# Patient Record
Sex: Female | Born: 1980 | Hispanic: Yes | Marital: Married | Smoking: Never smoker
Health system: Southern US, Community
[De-identification: ages and names within clinical notes are randomized; demographics above are authoritative.]

## PROBLEM LIST (undated history)

## (undated) DIAGNOSIS — K5900 Constipation, unspecified: Secondary | ICD-10-CM

## (undated) DIAGNOSIS — E079 Disorder of thyroid, unspecified: Secondary | ICD-10-CM

## (undated) DIAGNOSIS — I10 Essential (primary) hypertension: Secondary | ICD-10-CM

## (undated) HISTORY — DX: Disorder of thyroid, unspecified: E07.9

## (undated) HISTORY — DX: Constipation, unspecified: K59.00

---

## 2012-02-19 ENCOUNTER — Other Ambulatory Visit (HOSPITAL_COMMUNITY)
Admission: RE | Admit: 2012-02-19 | Discharge: 2012-02-19 | Disposition: A | Discharge status: Home / Self Care | Payer: 59 | Source: Ambulatory Visit | Attending: Family Medicine | Admitting: Family Medicine

## 2012-02-19 DIAGNOSIS — Z1151 Encounter for screening for human papillomavirus (HPV): Secondary | ICD-10-CM | POA: Insufficient documentation

## 2012-02-19 DIAGNOSIS — Z124 Encounter for screening for malignant neoplasm of cervix: Secondary | ICD-10-CM | POA: Insufficient documentation

## 2012-02-22 ENCOUNTER — Other Ambulatory Visit: Payer: Self-pay | Admitting: Family Medicine

## 2012-02-22 DIAGNOSIS — O905 Postpartum thyroiditis: Secondary | ICD-10-CM

## 2012-02-23 ENCOUNTER — Ambulatory Visit
Admission: RE | Admit: 2012-02-23 | Discharge: 2012-02-23 | Disposition: A | Discharge status: Home / Self Care | Payer: 59 | Source: Ambulatory Visit | Attending: Family Medicine | Admitting: Family Medicine

## 2012-02-23 DIAGNOSIS — O905 Postpartum thyroiditis: Secondary | ICD-10-CM

## 2012-11-22 ENCOUNTER — Encounter: Payer: 59 | Attending: Family Medicine | Admitting: *Deleted

## 2012-11-22 ENCOUNTER — Encounter (INDEPENDENT_AMBULATORY_CARE_PROVIDER_SITE_OTHER): Payer: Self-pay

## 2012-11-22 ENCOUNTER — Encounter: Payer: Self-pay | Admitting: *Deleted

## 2012-11-22 VITALS — Ht 61.0 in | Wt 161.0 lb

## 2012-11-22 DIAGNOSIS — Z713 Dietary counseling and surveillance: Secondary | ICD-10-CM | POA: Insufficient documentation

## 2012-11-22 DIAGNOSIS — E663 Overweight: Secondary | ICD-10-CM | POA: Insufficient documentation

## 2012-11-22 NOTE — Progress Notes (Signed)
  Medical Nutrition Therapy:  Appt start time: 1000 end time:  1100.   Assessment:  Primary concerns today: Maria Vasquez is here for nutrition counseling.  She wants to lose weight.  She recently had liposuction and a tummy tuck, but she is still unhappy with her weight.  She Work 3rd shift and her schedule is difficult.  She is moderately active, but her diet is low in whole grains, fruits, vegetables, and dairy.  She has hypothyroidism  MEDICATIONS: synthroid   DIETARY INTAKE:  Usual eating pattern includes 2-3 meals and 0-2 snacks per day.  Everyday foods include starches and proteins.    24-hr recall:  B ( AM): coffee with eggo with cheese  Snk ( AM): none  L ( PM): rice, fish, salad sometimes Snk ( PM): almonds or might not snack D ( PM): rice, meat, bread.  Maybe pasta with meat Snk ( PM): not usually.  Might have almonds Beverages: water  Usual physical activity: zumba twice a week.  And sometimes walks  Estimated energy needs: 1500 calories 170 g carbohydrates 112 g protein 42 g fat   Nutritional Diagnosis:  NB-1.1 Food and nutrition-related knowledge deficit As related to proper balance of different food groups.  As evidenced by dietary recall low in fruits, vegetables, dairy, and whole grains.    Intervention:  Nutrition counseling provided.  Discouraged excessive weight loss.   Discussed MyPlate recommendations for meal planning.  Encouraged more fruits, vegetables, lean proteins, and whole grains.  Recommended daily activity.  Discussed reading food labels for saturated fats, sodium, sugar, and fiber.  discouraged meal skipping  Handouts given during visit include:  Reading food labels  My meal plan card  Monitoring/Evaluation:  Dietary intake, exercise, and body weight prn.

## 2012-11-22 NOTE — Patient Instructions (Signed)
Starch: whole grains: wheat bread, brown rice,  Protein: lean is best- take the skin off, trim excess fat off meat; bake, broil, grill Fruit: fresh is best Vegetable: fresh is best Dairy: soy or almond milk 1 glass each day  Aim for 30 minutes physical activity most day.

## 2013-05-29 ENCOUNTER — Other Ambulatory Visit: Payer: Self-pay | Admitting: Family Medicine

## 2013-05-29 ENCOUNTER — Encounter (INDEPENDENT_AMBULATORY_CARE_PROVIDER_SITE_OTHER): Payer: Self-pay

## 2013-05-29 ENCOUNTER — Ambulatory Visit
Admission: RE | Admit: 2013-05-29 | Discharge: 2013-05-29 | Disposition: A | Discharge status: Home / Self Care | Payer: 59 | Source: Ambulatory Visit | Attending: Family Medicine | Admitting: Family Medicine

## 2013-05-29 DIAGNOSIS — M25559 Pain in unspecified hip: Secondary | ICD-10-CM

## 2016-07-04 DIAGNOSIS — Z79899 Other long term (current) drug therapy: Secondary | ICD-10-CM | POA: Diagnosis not present

## 2016-07-04 DIAGNOSIS — Z131 Encounter for screening for diabetes mellitus: Secondary | ICD-10-CM | POA: Diagnosis not present

## 2016-07-04 DIAGNOSIS — E559 Vitamin D deficiency, unspecified: Secondary | ICD-10-CM | POA: Diagnosis not present

## 2016-07-04 DIAGNOSIS — R5383 Other fatigue: Secondary | ICD-10-CM | POA: Diagnosis not present

## 2016-07-04 DIAGNOSIS — R0602 Shortness of breath: Secondary | ICD-10-CM | POA: Diagnosis not present

## 2016-07-04 DIAGNOSIS — E039 Hypothyroidism, unspecified: Secondary | ICD-10-CM | POA: Diagnosis not present

## 2016-07-05 DIAGNOSIS — E875 Hyperkalemia: Secondary | ICD-10-CM | POA: Diagnosis not present

## 2016-07-07 DIAGNOSIS — E559 Vitamin D deficiency, unspecified: Secondary | ICD-10-CM | POA: Diagnosis not present

## 2016-07-07 DIAGNOSIS — Z79899 Other long term (current) drug therapy: Secondary | ICD-10-CM | POA: Diagnosis not present

## 2016-08-05 DIAGNOSIS — Z79899 Other long term (current) drug therapy: Secondary | ICD-10-CM | POA: Diagnosis not present

## 2016-08-05 DIAGNOSIS — E559 Vitamin D deficiency, unspecified: Secondary | ICD-10-CM | POA: Diagnosis not present

## 2016-09-09 DIAGNOSIS — Z79899 Other long term (current) drug therapy: Secondary | ICD-10-CM | POA: Diagnosis not present

## 2016-09-30 DIAGNOSIS — Z79899 Other long term (current) drug therapy: Secondary | ICD-10-CM | POA: Diagnosis not present

## 2016-09-30 DIAGNOSIS — E559 Vitamin D deficiency, unspecified: Secondary | ICD-10-CM | POA: Diagnosis not present

## 2016-12-11 DIAGNOSIS — B9689 Other specified bacterial agents as the cause of diseases classified elsewhere: Secondary | ICD-10-CM | POA: Diagnosis not present

## 2016-12-11 DIAGNOSIS — J069 Acute upper respiratory infection, unspecified: Secondary | ICD-10-CM | POA: Diagnosis not present

## 2016-12-11 DIAGNOSIS — R509 Fever, unspecified: Secondary | ICD-10-CM | POA: Diagnosis not present

## 2016-12-11 DIAGNOSIS — R51 Headache: Secondary | ICD-10-CM | POA: Diagnosis not present

## 2016-12-13 DIAGNOSIS — E559 Vitamin D deficiency, unspecified: Secondary | ICD-10-CM | POA: Diagnosis not present

## 2016-12-13 DIAGNOSIS — J069 Acute upper respiratory infection, unspecified: Secondary | ICD-10-CM | POA: Diagnosis not present

## 2017-02-17 DIAGNOSIS — E559 Vitamin D deficiency, unspecified: Secondary | ICD-10-CM | POA: Diagnosis not present

## 2017-02-17 DIAGNOSIS — Z79899 Other long term (current) drug therapy: Secondary | ICD-10-CM | POA: Diagnosis not present

## 2017-02-20 DIAGNOSIS — Z8632 Personal history of gestational diabetes: Secondary | ICD-10-CM | POA: Diagnosis not present

## 2017-02-20 DIAGNOSIS — R61 Generalized hyperhidrosis: Secondary | ICD-10-CM | POA: Diagnosis not present

## 2017-02-20 DIAGNOSIS — E039 Hypothyroidism, unspecified: Secondary | ICD-10-CM | POA: Diagnosis not present

## 2017-05-22 DIAGNOSIS — A084 Viral intestinal infection, unspecified: Secondary | ICD-10-CM | POA: Diagnosis not present

## 2017-07-06 LAB — LAB REPORT - SCANNED: EGFR: 94

## 2017-11-01 ENCOUNTER — Ambulatory Visit
Admission: RE | Admit: 2017-11-01 | Discharge: 2017-11-01 | Disposition: A | Discharge status: Home / Self Care | Payer: Self-pay | Source: Ambulatory Visit | Attending: Family Medicine | Admitting: Family Medicine

## 2017-11-01 ENCOUNTER — Other Ambulatory Visit: Payer: Self-pay | Admitting: Family Medicine

## 2017-11-01 DIAGNOSIS — R52 Pain, unspecified: Secondary | ICD-10-CM

## 2017-11-01 DIAGNOSIS — E039 Hypothyroidism, unspecified: Secondary | ICD-10-CM | POA: Diagnosis not present

## 2017-11-01 DIAGNOSIS — M79641 Pain in right hand: Secondary | ICD-10-CM | POA: Diagnosis not present

## 2018-02-21 DIAGNOSIS — Z6831 Body mass index (BMI) 31.0-31.9, adult: Secondary | ICD-10-CM | POA: Diagnosis not present

## 2018-02-21 DIAGNOSIS — E039 Hypothyroidism, unspecified: Secondary | ICD-10-CM | POA: Diagnosis not present

## 2018-05-16 DIAGNOSIS — M62830 Muscle spasm of back: Secondary | ICD-10-CM | POA: Diagnosis not present

## 2018-06-28 DIAGNOSIS — Z20828 Contact with and (suspected) exposure to other viral communicable diseases: Secondary | ICD-10-CM | POA: Diagnosis not present

## 2018-06-28 DIAGNOSIS — R509 Fever, unspecified: Secondary | ICD-10-CM | POA: Diagnosis not present

## 2019-03-18 ENCOUNTER — Ambulatory Visit
Admission: RE | Admit: 2019-03-18 | Discharge: 2019-03-18 | Disposition: A | Discharge status: Home / Self Care | Payer: 59 | Source: Ambulatory Visit | Attending: Family Medicine | Admitting: Family Medicine

## 2019-03-18 ENCOUNTER — Other Ambulatory Visit: Payer: Self-pay | Admitting: Family Medicine

## 2019-03-18 DIAGNOSIS — R1031 Right lower quadrant pain: Secondary | ICD-10-CM

## 2019-03-18 MED ORDER — IOPAMIDOL (ISOVUE-300) INJECTION 61%
100.0000 mL | Freq: Once | INTRAVENOUS | Status: AC | PRN
  Administered 2019-03-18: 100 mL via INTRAVENOUS

## 2019-03-26 ENCOUNTER — Other Ambulatory Visit (HOSPITAL_COMMUNITY): Payer: Self-pay | Admitting: Family Medicine

## 2019-03-26 ENCOUNTER — Other Ambulatory Visit: Payer: Self-pay | Admitting: Family Medicine

## 2019-03-26 DIAGNOSIS — R1011 Right upper quadrant pain: Secondary | ICD-10-CM

## 2019-04-04 ENCOUNTER — Other Ambulatory Visit: Payer: Self-pay

## 2019-04-04 ENCOUNTER — Encounter (HOSPITAL_COMMUNITY)
Admission: RE | Admit: 2019-04-04 | Discharge: 2019-04-04 | Disposition: A | Discharge status: Home / Self Care | Payer: 59 | Source: Ambulatory Visit | Attending: Family Medicine | Admitting: Family Medicine

## 2019-04-04 DIAGNOSIS — R1011 Right upper quadrant pain: Secondary | ICD-10-CM | POA: Diagnosis not present

## 2019-04-04 MED ORDER — TECHNETIUM TC 99M MEBROFENIN IV KIT
5.0000 | PACK | Freq: Once | INTRAVENOUS | Status: AC | PRN
  Administered 2019-04-04: 14:00:00 5.25 via INTRAVENOUS

## 2019-04-21 ENCOUNTER — Other Ambulatory Visit: Payer: Self-pay

## 2019-04-21 ENCOUNTER — Encounter: Payer: 59 | Attending: Surgery | Admitting: Dietician

## 2019-04-21 ENCOUNTER — Encounter: Payer: Self-pay | Admitting: Dietician

## 2019-04-21 DIAGNOSIS — R7303 Prediabetes: Secondary | ICD-10-CM | POA: Diagnosis present

## 2019-04-21 DIAGNOSIS — E669 Obesity, unspecified: Secondary | ICD-10-CM | POA: Insufficient documentation

## 2019-04-21 NOTE — Patient Instructions (Addendum)
Consider drinking decaf coffee or tea or herbal tea prior to sleep. Recommend a MVI daily as well as calcium (take 2 hours apart) Continue to stay active. Aim for 3 small meals per day.  Include a small amount of protein at each meal, a complex carbohydrate choice, and non starchy vegetables. Avoid skipping meals.

## 2019-04-21 NOTE — Progress Notes (Signed)
Medical Nutrition Therapy:  Appt start time: 1100 end time:  1200.   Assessment:  Primary concerns today: Patient is here today alone.  She would like to lose weight and states that she would like the gastric sleeve surgery.    History includes GDM, prediabetes, hepatomegaly, fatty liver, hypercholesterolemia, hypothyroidism, depression.  She has seen a Psychologist, sport and exercise who has given her a letter of medical necessity for bariatric surgery. Last seen in our office in 2014.  She follows a very restrictive diet and only lost 1 lb in the past 3 weeks. She has tried multiple diets without long term weight loss and is currently trying to follow a keto diet. Sleep is inadequate most days of the week.  Bowel movements 3 times per day.  Diarrhea last night and was neon yellow.  She states that she has depression related to her weight and body image.  She reports that her husband is supportive of her current weight.  She has felt like harming herself in the past but not currently. She states that when she is depressed, she just wants to be alone.  She states that she has no one to call when she feels that.  Her family does not live locally. She states that she also has depression related to her health and liver pain.  Weight hx: 200 lbs 04/21/19 highest adult weight.  BMI 38 145 lbs lowest adult weight at 39 yo 160 lbs after liposuction and tummy tuck in 2014.    Sleep:  She finds it difficult to sleep 8 hours consecutively due to working 3rd shift and her daughter's doing school at home.  She usually sleeps from 7:30 am - 12:15 pm (4 hours & 45 minutes).  Sometimes she takes a nap for 1 1/2-2 hours prior to starting her shift.  On her off days she sleeps 9-12 hours at night.  Patient lives with her husband and 2 daughters (10 & 39 yo).  Her daughters are doing school at home.  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  She works 4 days per week and every other weekend.  She is from  [place].  Preferred Learning Style:   No preference indicated   Learning Readiness:   Ready  MEDICATIONS: see lit   DIETARY INTAKE: She eats only during specific hours each day.    24-hr recall:  B ( AM): coffee (unsweeted powdered creamer, scrambled eggs and cheese  Snk ( AM): none  L ( PM): salad with 5 oz [place] or lean chicken (iceburg lettuce, cabbage, broccoli, lemon juice) Snk ( PM): none D ( PM): none Snk ( PM): none Beverages: water, coffee   Usual physical activity: walks at work (purposefully) in 15-30 minute segments   Progress Towards Goal(s):  In progress.   Nutritional Diagnosis:  NB-1.1 Food and nutrition-related knowledge deficit As related to adequate nutrition.  As evidenced by diet hx.    Intervention:  Nutrition education/counseling provided related to body image and adequate nutrition.  Discussed the need to normalize her nutrition and meal plan.  Recommended unprocessed carbohydrates, lean meats, fresh fruit. Recommended a consistent meal plan. Recommended MVI and calcium daily due to current restrictive eating habits Recommended avoiding caffeine containig beverages prior to sleep.  Consider drinking decaf coffee or tea or herbal tea prior to sleep. Recommend a MVI daily as well as calcium (take 2 hours apart) Continue to stay active. Aim for 3 small meals per day.  Include a small  amount of protein at each meal, a complex carbohydrate choice, and non starchy vegetables. Avoid skipping meals..  Teaching Method Utilized:  Visual Auditory Hands on  Handouts given during visit include:  Bariatric Plate  Bariatric vitamins  Barriers to learning/adherence to lifestyle change: body image issues  Demonstrated degree of understanding via:  Teach Back   Monitoring/Evaluation:  Dietary intake, exercise, and body weight in 1 month(s) with a Bariatric dietitian in our office.Marland Kitchen

## 2019-05-19 ENCOUNTER — Ambulatory Visit: Payer: 59 | Admitting: Skilled Nursing Facility1

## 2019-10-26 IMAGING — CR DG HAND COMPLETE 3+V*R*
3 series · 3 of 3 positions shown · non-contrast
Comparison: None.

CLINICAL DATA: Acute onset right hand pain at the 5th MCP joint 2
days ago. No known injury.

EXAM:
RIGHT HAND - COMPLETE 3+ VIEW

[x hand pa right]
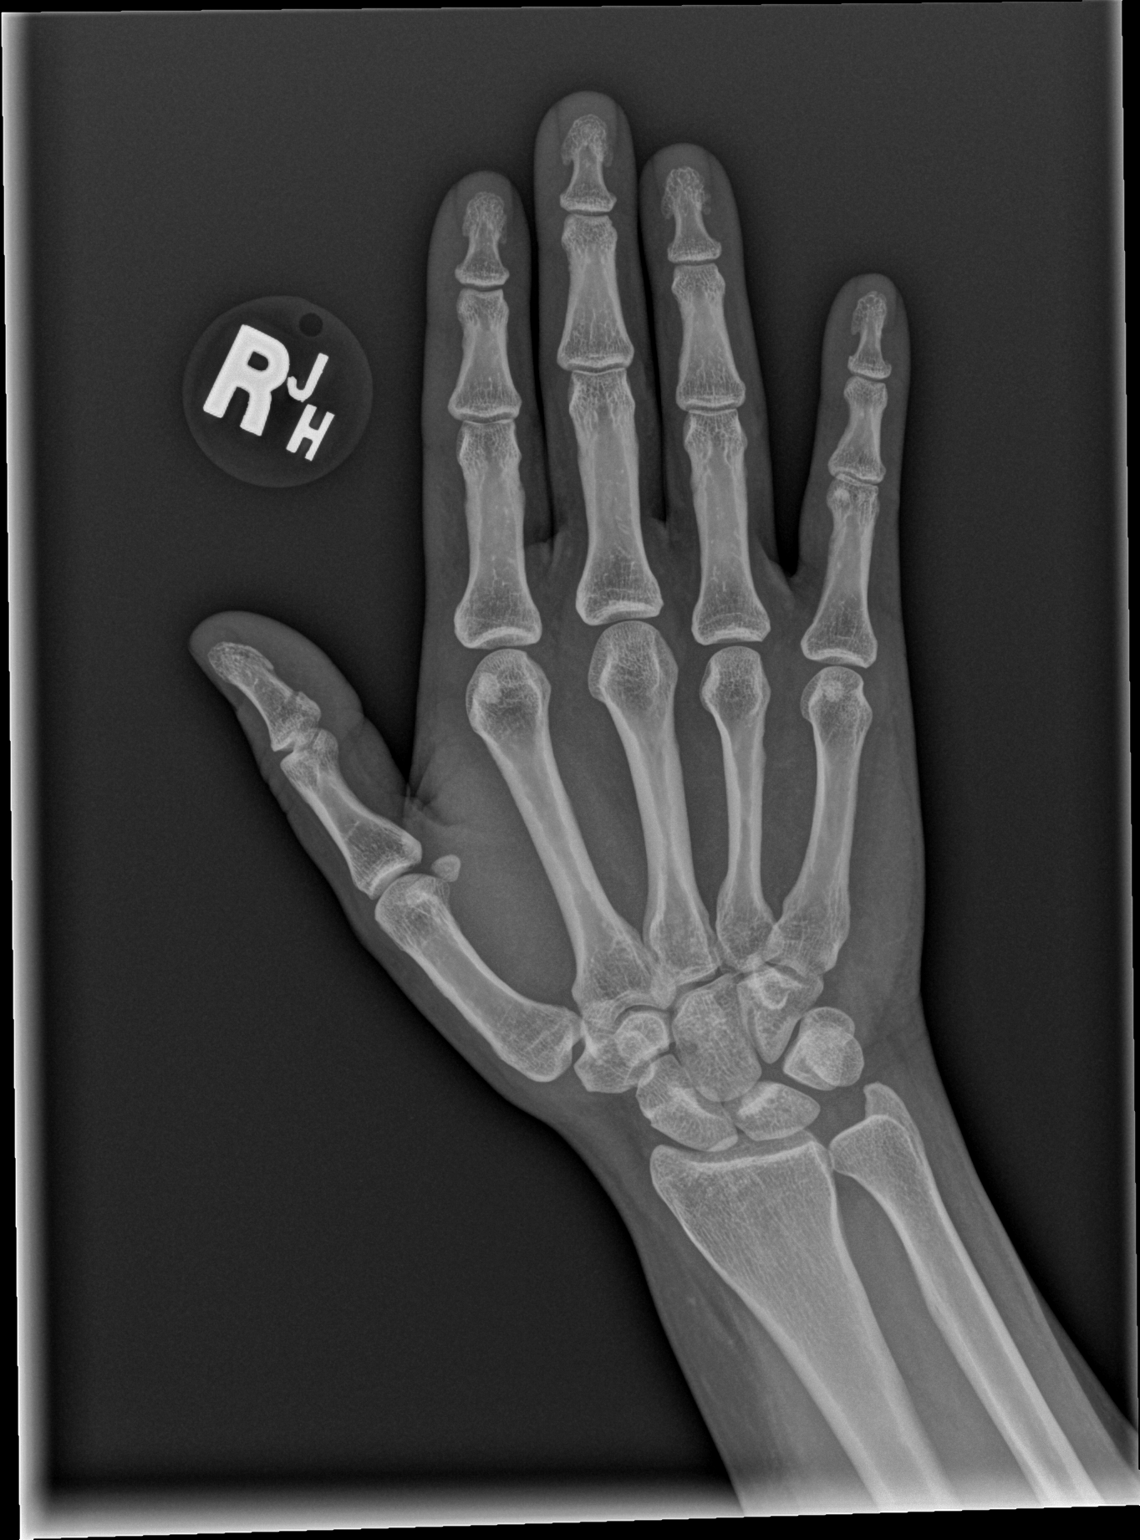

[x hand obl right]
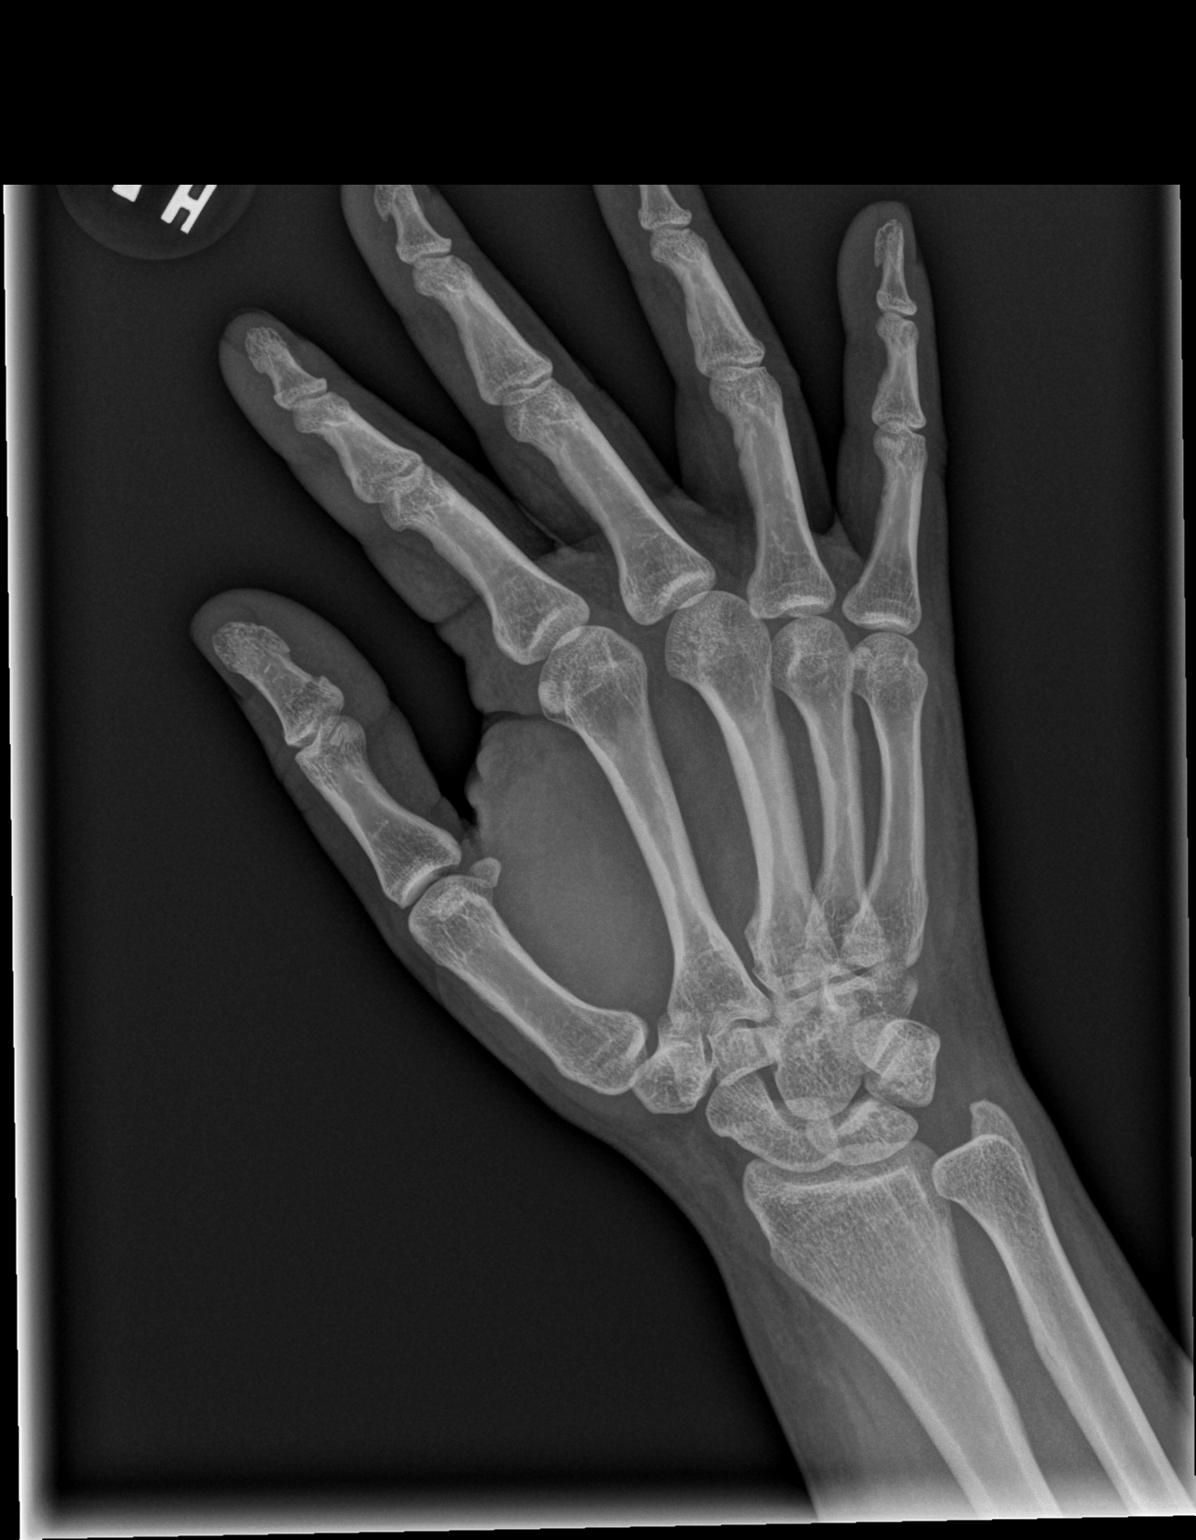

[x hand lat right]
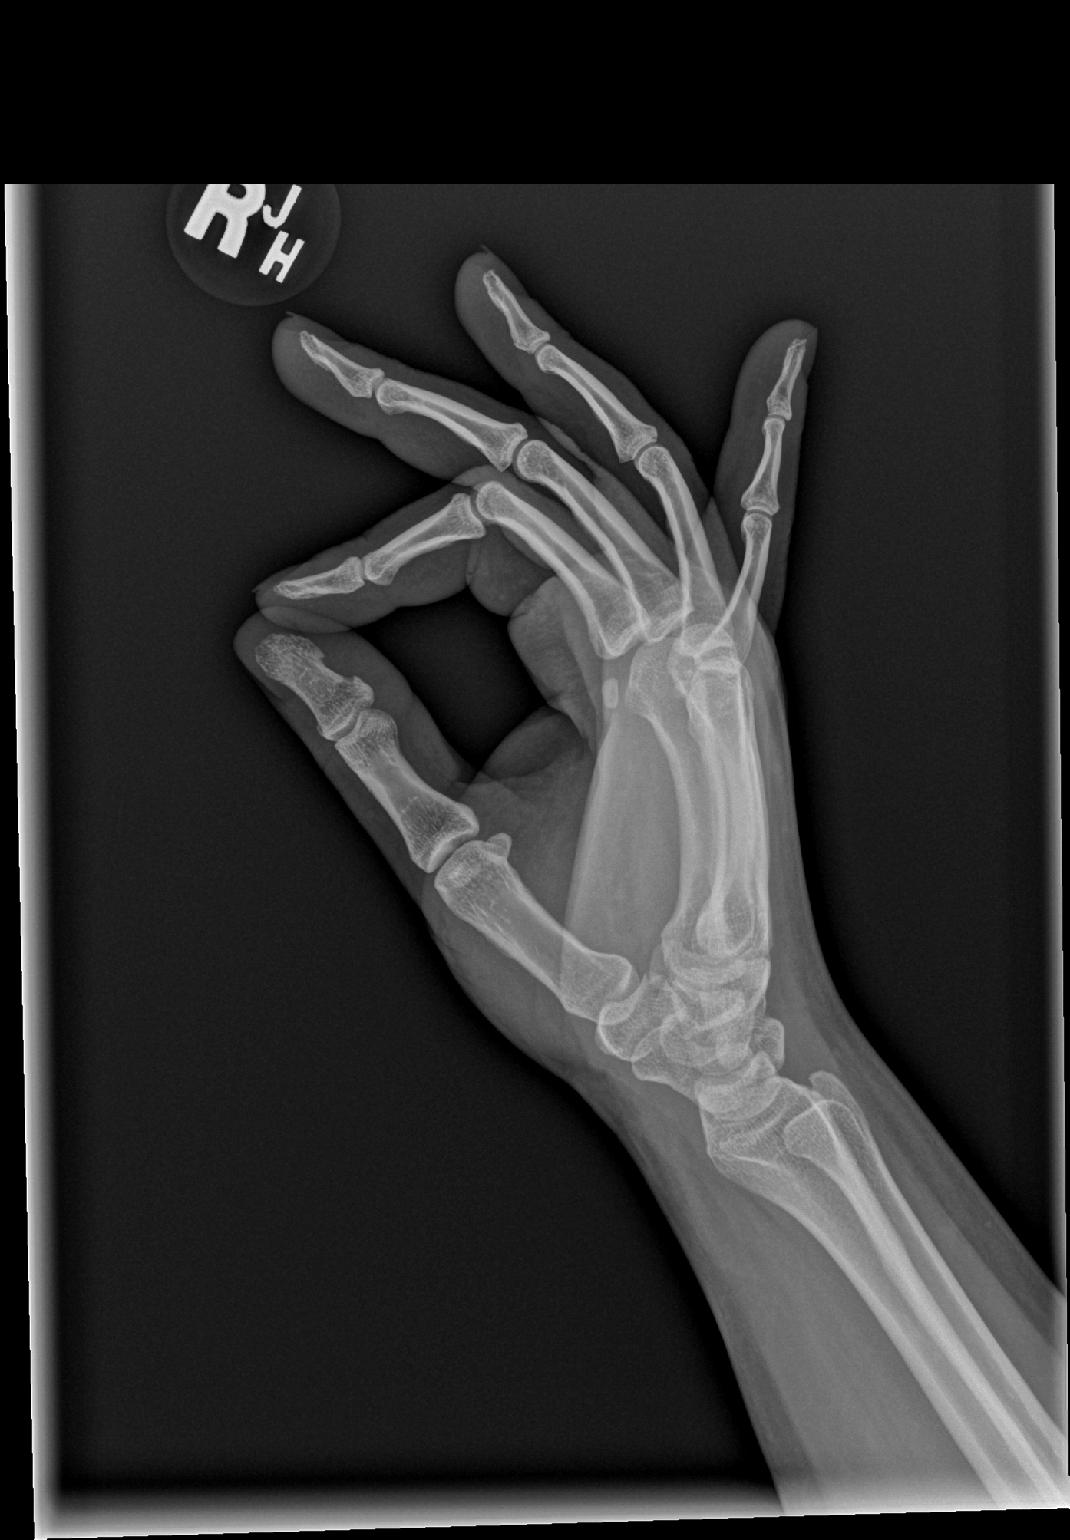

[3 of 3 positions shown; findings below may reference images not displayed]

FINDINGS: There is no evidence of fracture or dislocation. There is no
evidence of arthropathy or other focal bone abnormality. Soft
tissues are unremarkable.
IMPRESSION: Normal examination.

## 2020-02-07 HISTORY — PX: COSMETIC SURGERY: SHX468

## 2020-10-28 DIAGNOSIS — M25552 Pain in left hip: Secondary | ICD-10-CM | POA: Diagnosis not present

## 2020-11-10 DIAGNOSIS — Z20822 Contact with and (suspected) exposure to covid-19: Secondary | ICD-10-CM | POA: Diagnosis not present

## 2020-12-21 DIAGNOSIS — Z23 Encounter for immunization: Secondary | ICD-10-CM | POA: Diagnosis not present

## 2021-01-14 LAB — LAB REPORT - SCANNED: EGFR: 100

## 2021-03-11 IMAGING — CT CT ABD-PELV W/ CM
2 of 5 series · 11 of 46 positions shown, 12 images · IV contrast (iopamidol)
Comparison: None.

CLINICAL DATA: Worsening right upper and right lower quadrant pain
over the past month.

EXAM:
CT ABDOMEN AND PELVIS WITH CONTRAST
TECHNIQUE: Multidetector CT imaging of the abdomen and pelvis was performed
using the standard protocol following bolus administration of
intravenous contrast.
CONTRAST:  100 mL LVULRU-7OO IOPAMIDOL (LVULRU-7OO) INJECTION 61%

[Series 2: abd pelvis 5.00 br40 s3 axial · axial · 0.62mm/px · z∈[+1189,+1579]mm · 8 of 102 slices shown, 9 images]
[im 12/102  soft-tissue]
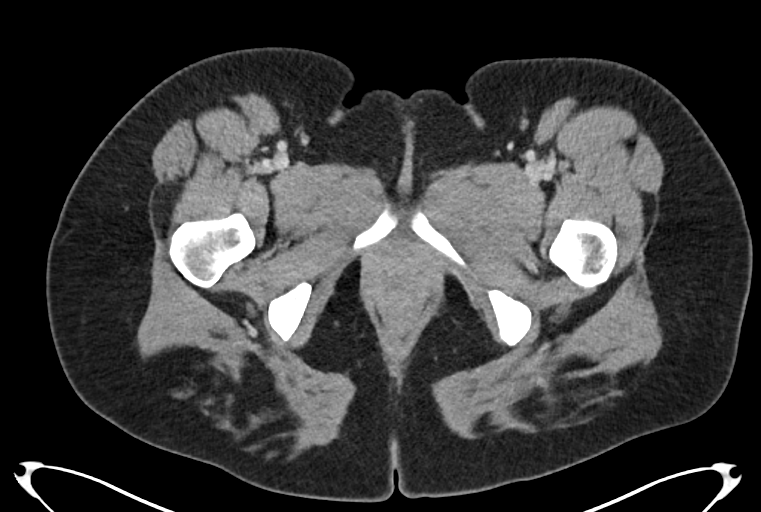
[im 12/102  bone]
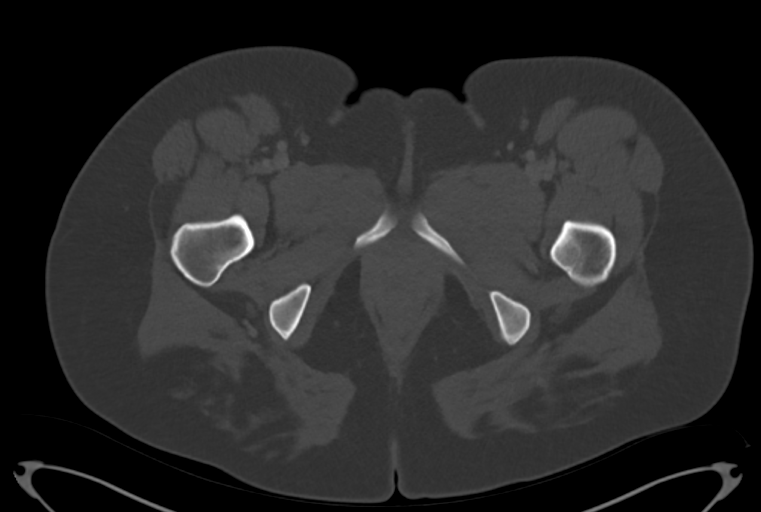
[im 23/102  soft-tissue]
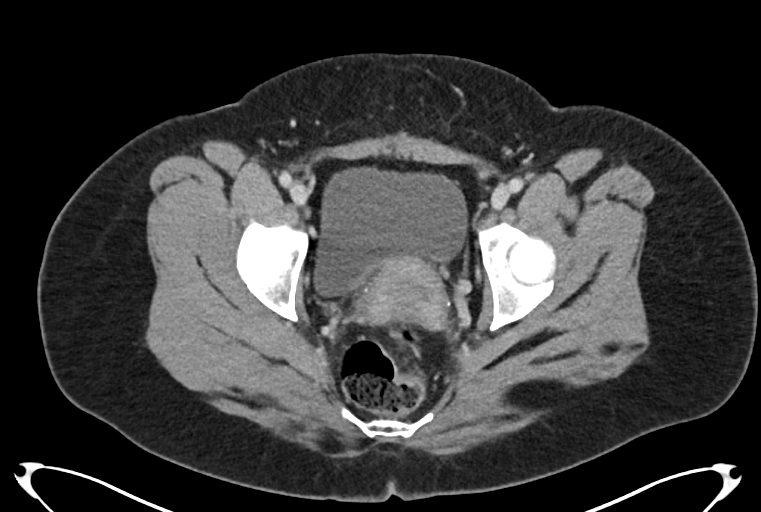
[im 34/102  soft-tissue]
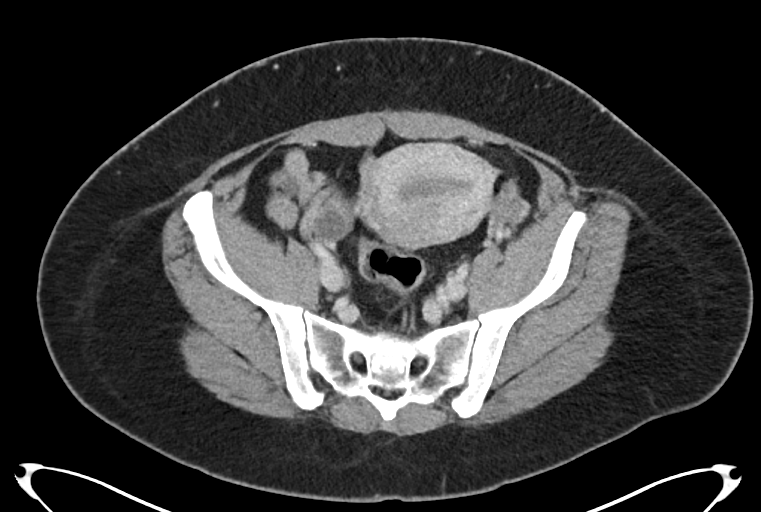
[im 45/102  soft-tissue]
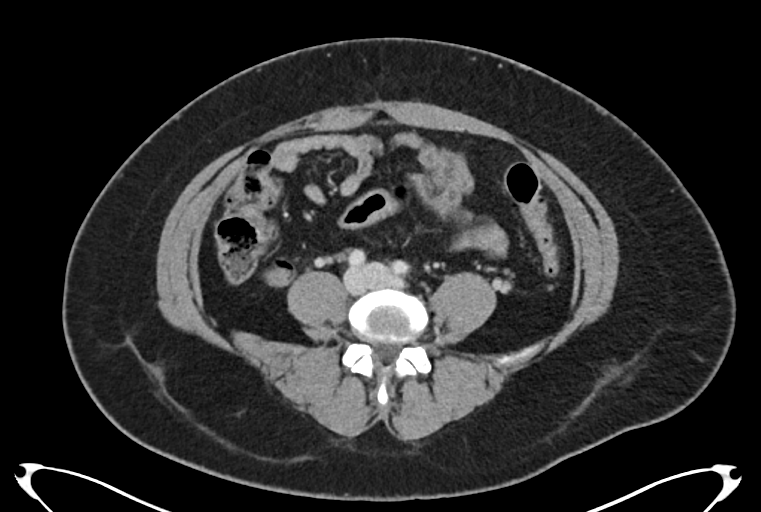
[im 57/102  soft-tissue]
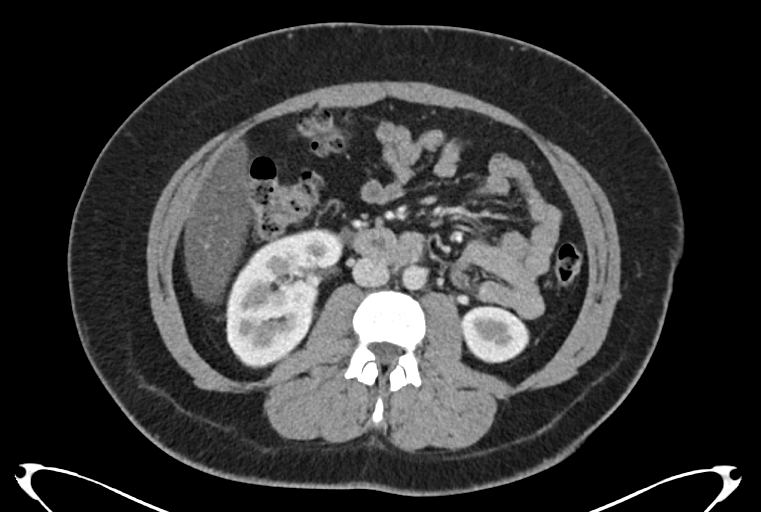
[im 68/102  soft-tissue]
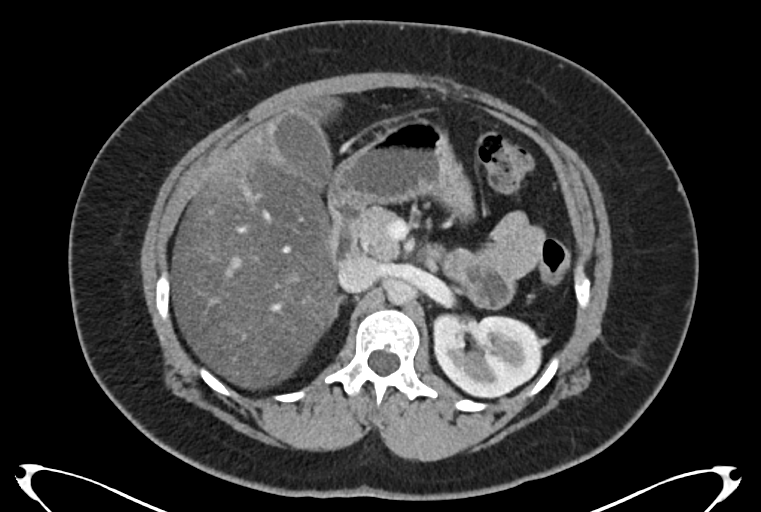
[im 79/102  soft-tissue]
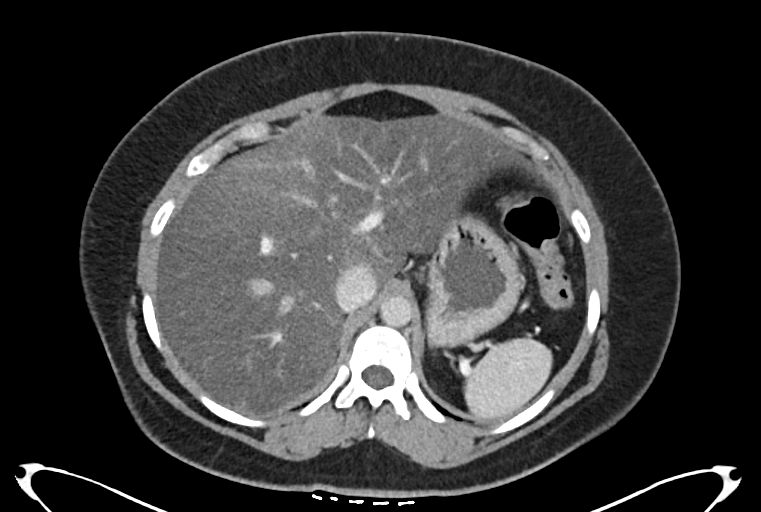
[im 90/102  soft-tissue]
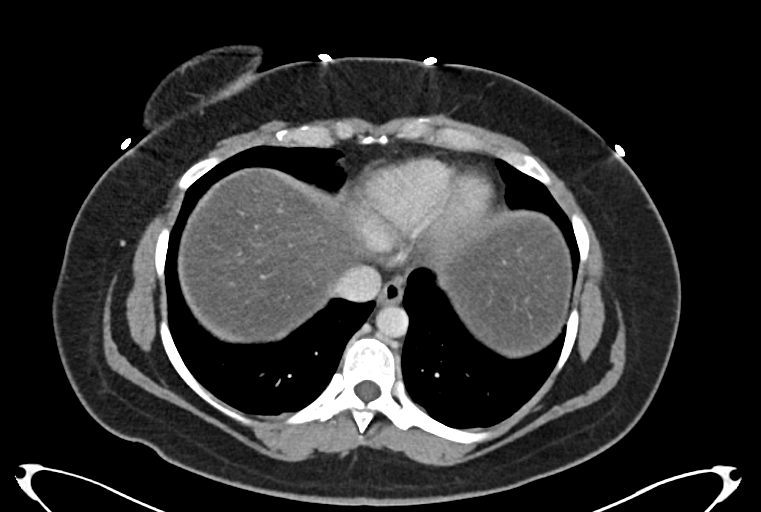

[Series 6: abd pelvis 2.00 br40 s3 cor · coronal · 0.88mm/px · 3 of 154 slices shown]
[im 52/154  soft-tissue]
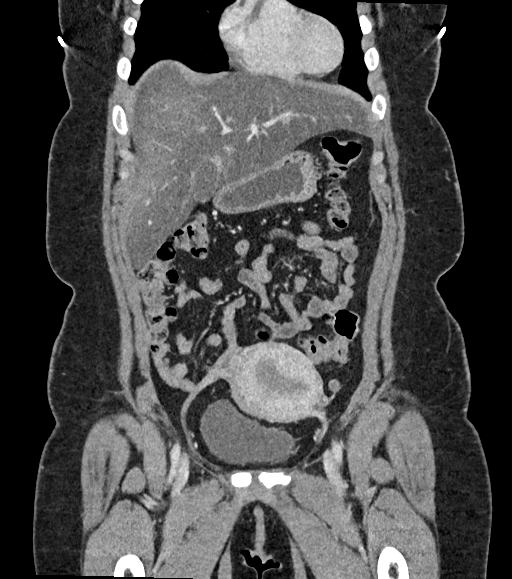
[im 69/154  soft-tissue]
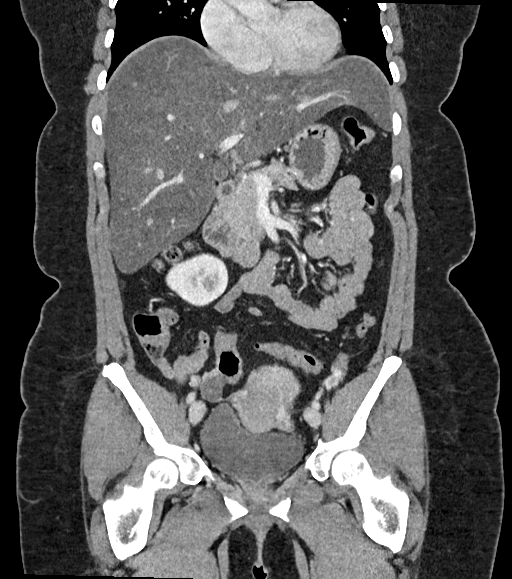
[im 86/154  soft-tissue]
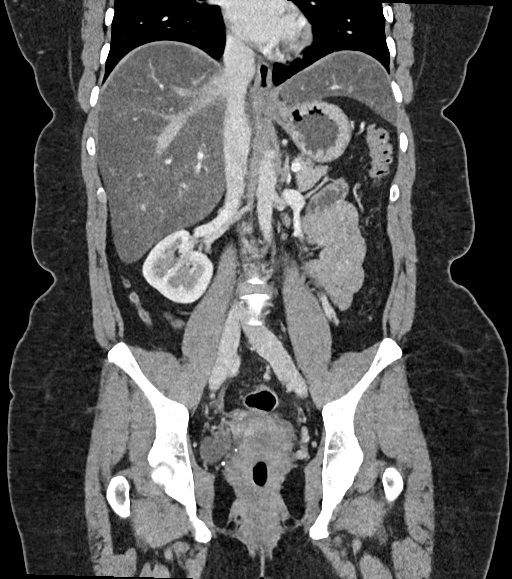

[11 of 46 positions shown; findings below may reference images not displayed]

FINDINGS: Lower chest: Lungs bases clear. Heart size normal. No pleural or
pericardial effusion.

Hepatobiliary: No focal liver abnormality is seen. No gallstones,
gallbladder wall thickening, or biliary dilatation. The liver is
diffusely low attenuating and the liver is 22 cm craniocaudal.

Pancreas: Unremarkable. No pancreatic ductal dilatation or
surrounding inflammatory changes.

Spleen: Normal in size without focal abnormality.

Adrenals/Urinary Tract: Adrenal glands are unremarkable. Kidneys are
normal, without renal calculi, focal lesion, or hydronephrosis.
Bladder is unremarkable. There is duplication of the right renal
collecting system and at least partial duplication of the right
ureters

Stomach/Bowel: Stomach is within normal limits. Appendix appears
normal. No evidence of bowel wall thickening, distention, or
inflammatory changes.

Vascular/Lymphatic: No significant vascular findings are present. No
enlarged abdominal or pelvic lymph nodes.

Reproductive: Uterus and bilateral adnexa are unremarkable.

Other: Small fat containing umbilical hernia is noted.

Musculoskeletal: Negative.
IMPRESSION: No acute abnormality abdomen or pelvis.

Hepatomegaly and marked fatty infiltration of the liver.

Duplicated right renal collecting system and at least partial
duplication of the right ureters.

## 2021-06-27 ENCOUNTER — Other Ambulatory Visit: Payer: Self-pay

## 2021-06-27 ENCOUNTER — Emergency Department (HOSPITAL_BASED_OUTPATIENT_CLINIC_OR_DEPARTMENT_OTHER)
Admission: EM | Admit: 2021-06-27 | Discharge: 2021-06-27 | Disposition: A | Discharge status: Home / Self Care | Payer: BC Managed Care – PPO | Attending: Emergency Medicine | Admitting: Emergency Medicine

## 2021-06-27 ENCOUNTER — Encounter (HOSPITAL_BASED_OUTPATIENT_CLINIC_OR_DEPARTMENT_OTHER): Payer: Self-pay

## 2021-06-27 DIAGNOSIS — M79673 Pain in unspecified foot: Secondary | ICD-10-CM | POA: Diagnosis not present

## 2021-06-27 DIAGNOSIS — R252 Cramp and spasm: Secondary | ICD-10-CM | POA: Diagnosis not present

## 2021-06-27 DIAGNOSIS — E876 Hypokalemia: Secondary | ICD-10-CM | POA: Insufficient documentation

## 2021-06-27 DIAGNOSIS — Z743 Need for continuous supervision: Secondary | ICD-10-CM | POA: Diagnosis not present

## 2021-06-27 LAB — CBC WITH DIFFERENTIAL/PLATELET
Abs Immature Granulocytes: 0.04 10*3/uL (ref 0.00–0.07)
Basophils Absolute: 0.1 10*3/uL (ref 0.0–0.1)
Basophils Relative: 1 %
Eosinophils Absolute: 0.2 10*3/uL (ref 0.0–0.5)
Eosinophils Relative: 2 %
HCT: 43.4 % (ref 36.0–46.0)
Hemoglobin: 15.3 g/dL — ABNORMAL HIGH (ref 12.0–15.0)
Immature Granulocytes: 0 %
Lymphocytes Relative: 14 %
Lymphs Abs: 1.6 10*3/uL (ref 0.7–4.0)
MCH: 31.3 pg (ref 26.0–34.0)
MCHC: 35.3 g/dL (ref 30.0–36.0)
MCV: 88.8 fL (ref 80.0–100.0)
Monocytes Absolute: 1 10*3/uL (ref 0.1–1.0)
Monocytes Relative: 9 %
Neutro Abs: 8.6 10*3/uL — ABNORMAL HIGH (ref 1.7–7.7)
Neutrophils Relative %: 74 %
Platelets: 368 10*3/uL (ref 150–400)
RBC: 4.89 MIL/uL (ref 3.87–5.11)
RDW: 13 % (ref 11.5–15.5)
WBC: 11.6 10*3/uL — ABNORMAL HIGH (ref 4.0–10.5)
nRBC: 0 % (ref 0.0–0.2)

## 2021-06-27 LAB — BASIC METABOLIC PANEL
Anion gap: 15 (ref 5–15)
BUN: 13 mg/dL (ref 6–20)
CO2: 25 mmol/L (ref 22–32)
Calcium: 9.4 mg/dL (ref 8.9–10.3)
Chloride: 102 mmol/L (ref 98–111)
Creatinine, Ser: 0.79 mg/dL (ref 0.44–1.00)
GFR, Estimated: >60 mL/min (ref >60)
Glucose, Bld: 89 mg/dL (ref 70–99)
Potassium: 2.7 mmol/L — CL (ref 3.5–5.1)
Sodium: 142 mmol/L (ref 135–145)

## 2021-06-27 LAB — MAGNESIUM: Magnesium: 2.2 mg/dL (ref 1.7–2.4)

## 2021-06-27 MED ORDER — POTASSIUM CHLORIDE ER 20 MEQ PO TBCR: 20.0000 meq | EXTENDED_RELEASE_TABLET | Freq: Every day | ORAL | 0 refills | Status: DC

## 2021-06-27 MED ORDER — POTASSIUM CHLORIDE CRYS ER 20 MEQ PO TBCR
40.0000 meq | EXTENDED_RELEASE_TABLET | Freq: Once | ORAL | Status: AC
  Administered 2021-06-27: 40 meq via ORAL
  Filled 2021-06-27: qty 2

## 2021-06-27 MED ORDER — ACETAMINOPHEN 325 MG PO TABS
650.0000 mg | ORAL_TABLET | Freq: Once | ORAL | Status: AC
  Administered 2021-06-27: 650 mg via ORAL
  Filled 2021-06-27: qty 2

## 2021-06-27 MED ORDER — SODIUM CHLORIDE 0.9 % IV BOLUS
1000.0000 mL | Freq: Once | INTRAVENOUS | Status: AC
  Administered 2021-06-27: 1000 mL via INTRAVENOUS

## 2021-06-27 NOTE — Discharge Instructions (Addendum)
Follow-up with your doctor this week for repeat blood test regarding your potassium levels.  Continue to increase your fluid intake at home.  Return back to the ER if you have worsening symptoms fevers pain or any additional concerns.

## 2021-06-27 NOTE — ED Notes (Signed)
Discharge paperwork given and understood. 

## 2021-06-27 NOTE — ED Provider Notes (Signed)
[facility] EMERGENCY DEPT Provider Note   CSN: [id number] Arrival date & time: 06/27/21  1135     History  Chief Complaint  Patient presents with   Leg Pain    Maria Vasquez is a 41 y.o. female.  Patient presents to ER chief complaint of whole body cramping.  She says she woke up with a left calf cramp in her ankle feeling locked up.  She called her husband over who was massaging it.  Then she states that her right calf started cramping which got her more concerned.  And then bilateral hands and wrists started cramping and she was unable to open up her hands.  Symptoms lasted for about 20-30 minutes and have eased up now.  Denies any headache or chest pain abdominal pain no fever no cough no vomiting no diarrhea.      Home Medications Prior to Admission medications   Medication Sig Start Date End Date Taking? Authorizing Provider  potassium chloride 20 MEQ TBCR Take 20 mEq by mouth daily for 4 days. 06/27/21 07/01/21 Yes Cheryll Cockayne, MD  Levothyroxine Sodium (SYNTHROID PO) Take by mouth.    [provider]  NON FORMULARY Prevalite powder.  Patient states it helps her liver and to have regular bowel movements. 1 Scoop bid mixed in water.    [provider]      Allergies    Patient has no known allergies.    Review of Systems   Review of Systems  Constitutional:  Negative for fever.  HENT:  Negative for ear pain.   Eyes:  Negative for pain.  Respiratory:  Negative for cough.   Cardiovascular:  Negative for chest pain.  Gastrointestinal:  Negative for abdominal pain.  Genitourinary:  Negative for flank pain.  Musculoskeletal:  Negative for back pain.  Skin:  Negative for rash.  Neurological:  Negative for headaches.   Physical Exam Updated Vital Signs BP 125/81   Pulse 81   Temp 97.9 F (36.6 C)   Resp 18   Ht 5' 0.75" (1.543 m)   Wt 90.7 kg   SpO2 100%   BMI 38.09 kg/m  Physical Exam Constitutional:      General: She is  not in acute distress.    Appearance: Normal appearance.  HENT:     Head: Normocephalic.     Nose: Nose normal.  Eyes:     Extraocular Movements: Extraocular movements intact.  Cardiovascular:     Rate and Rhythm: Normal rate.  Pulmonary:     Effort: Pulmonary effort is normal.  Musculoskeletal:        General: Normal range of motion.     Cervical back: Normal range of motion.     Comments: No lower extremity swelling or edema noted.  Mild bilateral calf tenderness noted.  Neurological:     General: No focal deficit present.     Mental Status: She is alert. Mental status is at baseline.     Comments: 5/5 strength all extremities.  No focal neurodeficit noted.    ED Results / Procedures / Treatments   Labs (all labs ordered are listed, but only abnormal results are displayed) Labs Reviewed  CBC WITH DIFFERENTIAL/PLATELET - Abnormal; Notable for the following components:      Result Value   WBC 11.6 (*)    Hemoglobin 15.3 (*)    Neutro Abs 8.6 (*)    All other components within normal limits  BASIC METABOLIC PANEL - Abnormal; Notable for the following  components:   Potassium 2.7 (*)    All other components within normal limits  MAGNESIUM    EKG None  Radiology No results found.  Procedures Procedures    Medications Ordered in ED Medications  sodium chloride 0.9 % bolus 1,000 mL (0 mLs Intravenous Stopped 06/27/21 1341)  acetaminophen (TYLENOL) tablet 650 mg (650 mg Oral Given 06/27/21 1239)  potassium chloride SA (KLOR-CON M) CR tablet 40 mEq (40 mEq Oral Given 06/27/21 1349)    ED Course/ Medical Decision Making/ A&P                           Medical Decision Making Amount and/or Complexity of Data Reviewed Labs: ordered.  Risk OTC drugs. Prescription drug management.   History obtained from family at bedside.  Cardiac monitoring shows sinus rhythm.  Review of record shows outpatient office visit for COVID-19 exposure October for 2022.  [place] Levo studies  include basic labs chemistry found potassium of 2.7.  This was repleted with 40 mEq here in the ER.  Patient given IV fluids and Tylenol with improvement of symptoms.  I did consider DVT but this does not appear consistent with history or exam.  I suspect carpal spasms.  We will recommend outpatient follow-up with her doctor this week for repeat potassium levels.  Given a prescription of potassium to go home with.  Advised immediate return for worsening symptoms fevers pain or any additional concerns.        Final Clinical Impression(s) / ED Diagnoses Final diagnoses:  Cramping of feet  Cramping of hands  Hypokalemia    Rx / DC Orders ED Discharge Orders          Ordered    potassium chloride 20 MEQ TBCR  Daily        06/27/21 1438              Cheryll Cockayne, MD 06/27/21 1438

## 2021-06-27 NOTE — ED Triage Notes (Signed)
Patient BIB [facility] from Home.  Endorses waking up this AM with Bilateral Leg Cramping. No Recent Changes in Medication. No Changes in Diet also.   States Originally Cramping was generalized throughout Arms, Hands, Face, Legs but has subsided to Bilateral Legs only.  No Fevers. No N/V/D.  NAD Noted during Triage. A&Ox. GCS 15. BIB Wheelchair.

## 2021-06-30 DIAGNOSIS — E538 Deficiency of other specified B group vitamins: Secondary | ICD-10-CM | POA: Diagnosis not present

## 2021-06-30 DIAGNOSIS — Z23 Encounter for immunization: Secondary | ICD-10-CM | POA: Diagnosis not present

## 2021-06-30 DIAGNOSIS — E876 Hypokalemia: Secondary | ICD-10-CM | POA: Diagnosis not present

## 2021-11-01 DIAGNOSIS — E559 Vitamin D deficiency, unspecified: Secondary | ICD-10-CM | POA: Diagnosis not present

## 2021-11-01 DIAGNOSIS — N921 Excessive and frequent menstruation with irregular cycle: Secondary | ICD-10-CM | POA: Diagnosis not present

## 2021-11-01 DIAGNOSIS — E039 Hypothyroidism, unspecified: Secondary | ICD-10-CM | POA: Diagnosis not present

## 2021-11-01 DIAGNOSIS — Z8632 Personal history of gestational diabetes: Secondary | ICD-10-CM | POA: Diagnosis not present

## 2021-11-01 DIAGNOSIS — R030 Elevated blood-pressure reading, without diagnosis of hypertension: Secondary | ICD-10-CM | POA: Diagnosis not present

## 2022-04-12 ENCOUNTER — Encounter: Payer: Self-pay | Admitting: Family Medicine

## 2022-04-12 ENCOUNTER — Ambulatory Visit: Payer: BC Managed Care – PPO | Admitting: Family Medicine

## 2022-04-12 VITALS — BP 124/70 | HR 85 | Temp 98.2°F | Ht 61.0 in | Wt 169.2 lb

## 2022-04-12 DIAGNOSIS — R635 Abnormal weight gain: Secondary | ICD-10-CM

## 2022-04-12 DIAGNOSIS — E038 Other specified hypothyroidism: Secondary | ICD-10-CM | POA: Diagnosis not present

## 2022-04-12 DIAGNOSIS — R002 Palpitations: Secondary | ICD-10-CM | POA: Diagnosis not present

## 2022-04-12 DIAGNOSIS — R600 Localized edema: Secondary | ICD-10-CM

## 2022-04-12 DIAGNOSIS — K5904 Chronic idiopathic constipation: Secondary | ICD-10-CM

## 2022-04-12 LAB — CBC WITH DIFFERENTIAL/PLATELET
Basophils Absolute: 0.1 10*3/uL (ref 0.0–0.1)
Basophils Relative: 0.6 % (ref 0.0–3.0)
Eosinophils Absolute: 0.2 10*3/uL (ref 0.0–0.7)
Eosinophils Relative: 1.6 % (ref 0.0–5.0)
HCT: 41.8 % (ref 36.0–46.0)
Hemoglobin: 14.3 g/dL (ref 12.0–15.0)
Lymphocytes Relative: 21.9 % (ref 12.0–46.0)
Lymphs Abs: 2.5 10*3/uL (ref 0.7–4.0)
MCHC: 34.2 g/dL (ref 30.0–36.0)
MCV: 91.5 fl (ref 78.0–100.0)
Monocytes Absolute: 1.1 10*3/uL — ABNORMAL HIGH (ref 0.1–1.0)
Monocytes Relative: 9.8 % (ref 3.0–12.0)
Neutro Abs: 7.7 10*3/uL (ref 1.4–7.7)
Neutrophils Relative %: 66.1 % (ref 43.0–77.0)
Platelets: 393 10*3/uL (ref 150.0–400.0)
RBC: 4.56 Mil/uL (ref 3.87–5.11)
RDW: 14 % (ref 11.5–15.5)
WBC: 11.6 10*3/uL — ABNORMAL HIGH (ref 4.0–10.5)

## 2022-04-12 LAB — URINALYSIS, ROUTINE W REFLEX MICROSCOPIC
Bilirubin Urine: NEGATIVE
Ketones, ur: NEGATIVE
Leukocytes,Ua: NEGATIVE
Nitrite: NEGATIVE
Specific Gravity, Urine: 1.02 (ref 1.000–1.030)
Total Protein, Urine: NEGATIVE
Urine Glucose: NEGATIVE
Urobilinogen, UA: 0.2 (ref 0.0–1.0)
pH: 7 (ref 5.0–8.0)

## 2022-04-12 LAB — COMPREHENSIVE METABOLIC PANEL
ALT: 44 U/L — ABNORMAL HIGH (ref 0–35)
AST: 26 U/L (ref 0–37)
Albumin: 4.2 g/dL (ref 3.5–5.2)
Alkaline Phosphatase: 64 U/L (ref 39–117)
BUN: 23 mg/dL (ref 6–23)
CO2: 30 mEq/L (ref 19–32)
Calcium: 10.1 mg/dL (ref 8.4–10.5)
Chloride: 99 mEq/L (ref 96–112)
Creatinine, Ser: 0.68 mg/dL (ref 0.40–1.20)
GFR: 106.51 mL/min (ref >60.00)
Glucose, Bld: 84 mg/dL (ref 70–99)
Potassium: 4.6 mEq/L (ref 3.5–5.1)
Sodium: 140 mEq/L (ref 135–145)
Total Bilirubin: 0.6 mg/dL (ref 0.2–1.2)
Total Protein: 7.3 g/dL (ref 6.0–8.3)

## 2022-04-12 LAB — CORTISOL: Cortisol, Plasma: 8.6 ug/dL

## 2022-04-12 LAB — TSH: TSH: 5.78 u[IU]/mL — ABNORMAL HIGH (ref 0.35–5.50)

## 2022-04-12 LAB — T3, FREE: T3, Free: 3.1 pg/mL (ref 2.3–4.2)

## 2022-04-12 LAB — MAGNESIUM: Magnesium: 2 mg/dL (ref 1.5–2.5)

## 2022-04-12 LAB — HEMOGLOBIN A1C: Hgb A1c MFr Bld: 5.4 % (ref 4.6–6.5)

## 2022-04-12 LAB — T4, FREE: Free T4: 0.77 ng/dL (ref 0.60–1.60)

## 2022-04-12 NOTE — Progress Notes (Signed)
New Patient Office Visit  Subjective:  Patient ID: Maria Vasquez, female    DOB: 04-06-1980  Age: 42 y.o. MRN: [id number]  CC:  Chief Complaint  Patient presents with   Establish Care    Need new pcp Gained 20lbs in 3 weeks No food, had coffee    HPI Erick [place] presents for new pt. Changing providers([facility]) Gaining wt.Minette Brine from [place]  Vitamin D def-on supps K low 1 yr ago-unclear why-muscle cramps.  1 episode of vomiting Elevated bp so on hctz for sev mo-taking qod.   Hypothyroidism-on synthroid 100.for few mo. Wt gain-gains "very quickly"-went to nutritionist, doctors, etc.  Can gain 6-7# in 1 wk.  3 wks aago150 on her scale, now 167.  Hands puffy and legs and stomach can get bloated.  Exercise:-walks all day at work. And has "mini gym" at home.  Very frustrated w/wt.  Very restrictive diet at times but not conducive long term  Past Medical History:  Diagnosis Date   Thyroid disease     Past Surgical History:  Procedure Laterality Date   COSMETIC SURGERY  2022   tummy tuck    Family History  Problem Relation Age of Onset   Diabetes Mother    Alcohol abuse Mother    Diabetes Father    Alcohol abuse Father     Social History   Socioeconomic History   Marital status: Married    Spouse name: Not on file   Number of children: 2   Years of education: Not on file   Highest education level: Not on file  Occupational History   Occupation: CNA    Comment: [employer]  Tobacco Use   Smoking status: Never   Smokeless tobacco: Never  Vaping Use   Vaping Use: Never used  Substance and Sexual Activity   Alcohol use: Yes    Comment: occ   Drug use: Never   Sexual activity: Yes    Birth control/protection: Surgical    Comment: vasectomy husb  Other Topics Concern   Not on file  Social History Narrative   Not on file   Social Determinants of Health   Financial Resource Strain: Not on file  Food Insecurity: Not on file  Transportation Needs: Not on file   Physical Activity: Not on file  Stress: Not on file  Social Connections: Not on file  Intimate Partner Violence: Not on file    ROS  ROS: Gen: no fever, chills  Skin: no rash, itching ENT: no ear pain, ear drainage, nasal congestion, rhinorrhea, sinus pressure, sore throat Eyes: no blurry vision, double vision Resp: no cough, wheeze,SOB CV: no CP.  Palpitations intermitt every few days.-lasts about 1 min.  No other symptoms.  1 cup caffeine/day.   Pulse will be 150's GI: no heartburn, n/v/d abd pain.  +constipation-drinks a lot of water. Can go 3-4 days w/o bm. Stool softeners not help.  Did colon cleansing once.  Never tried miralax.   GU: no dysuria, urgency, frequency, hematuria.  Menses regular.  Vasectomy for birth control.  Had a Pap several months ago, but states "not enough cells" MSK: no joint pain, myalgias, back pain Neuro: no dizziness, headache, weakness, vertigo Psych: no depression, anxiety, insomnia, SI   Objective:   Today's Vitals: BP 124/70   Pulse 85   Temp 98.2 F (36.8 C) (Temporal)   Ht '5\' 1"'$  (1.549 m)   Wt 169 lb 4 oz (76.8 kg)   LMP 03/13/2022 (Exact Date)  SpO2 98%   BMI 31.98 kg/m   Physical Exam  Gen: WDWN NAD HEENT: NCAT, conjunctiva not injected, sclera nonicteric NECK:  supple, no thyromegaly, no nodes, no carotid bruits CARDIAC: RRR, S1S2+, no murmur. DP 2+B LUNGS: CTAB. No wheezes ABDOMEN:  BS+, soft, NTND, No HSM, no masses EXT:  no edema MSK: no gross abnormalities.  NEURO: A&O x3.  CN II-XII intact.  PSYCH: normal mood. Good eye contact   Reviewed notes/labs from [facility] 11/01/21, and ED  Assessment & Plan:   Problem List Items Addressed This Visit       Endocrine   Other specified hypothyroidism - Primary   Relevant Medications   levothyroxine (SYNTHROID) 100 MCG tablet   Other Visit Diagnoses     Local edema       Weight gain       Palpitation       Chronic idiopathic constipation         1.   Hypothyroidism-chronic.  Was controlled in September, however patient states that her dose was increased to 100.  Continue Synthroid 100.  Check TSH, free T3, free T4 2.  Local edema and elevated blood pressure.  She was given hydrochlorothiazide 12.5 mg.  However, only taking it about every other day.  She does have a history of hypokalemia.  Will check CBC, CMP, magnesium, cortisol 3.  Palpitations-occurring about every 2 to 3 days.  Heart rate can be as high as 150.  EKG was normal sinus rhythm, no acute ST changes.  Will check CBC, CMP, TSH, magnesium, cortisol.  Refer to cardiology 4.  Chronic idiopathic constipation-trial MiraLAX daily.  She has failed stool softeners.  Drinks plenty of water 5.  Weight gain-per patient, almost 20 pounds in 3 weeks.  She has not really changed her diet.  Is very active at work and also exercises at her home gym.  She has tried multiple diets, nutritionist.  Did not qualify for bariatric surgery.  She is very frustrated.  Advised to continue working on diet/exercise.  Try to lower carbohydrates.  Follow-up in 1 month check labs and A1c  Follow-up 1 month for weight and Pap  Outpatient Encounter Medications as of 04/12/2022  Medication Sig   hydrochlorothiazide (MICROZIDE) 12.5 MG capsule 1 capsule in the morning Orally Once a day for 30 day(s)   levothyroxine (SYNTHROID) 100 MCG tablet 1 tablet in the morning on an empty stomach Orally Once a day for 90 days   Vitamin D, Ergocalciferol, 50000 units CAPS d2   [DISCONTINUED] Levothyroxine Sodium (SYNTHROID PO) Take by mouth.   [DISCONTINUED] NON FORMULARY Prevalite powder.  Patient states it helps her liver and to have regular bowel movements. 1 Scoop bid mixed in water.   [DISCONTINUED] potassium chloride 20 MEQ TBCR Take 20 mEq by mouth daily for 4 days.   No facility-administered encounter medications on file as of 04/12/2022.    Follow-up: No follow-ups on file.   Wellington Hampshire, MD

## 2022-04-12 NOTE — Patient Instructions (Signed)
Welcome to [facility]! It was a pleasure meeting you today.  As discussed, Please schedule a 1 month follow up visit today.  Add electrolytes to water once/day Sending to cardiologist.   Miralax daily  PLEASE NOTE:  If you had any LAB tests please let us know if you have not heard back within a few days. You may see your results on MyChart before we have a chance to review them but we will give you a call once they are reviewed by [place]. If we ordered any REFERRALS today, please let us know if you have not heard from their office within the next week.  Let us know through MyChart if you are needing REFILLS, or have your pharmacy send [place] the request. You can also use MyChart to communicate with me or any office staff.  Please try these tips to maintain a healthy lifestyle:  Eat most of your calories during the day when you are active. Eliminate processed foods including packaged sweets (pies, cakes, cookies), reduce intake of potatoes, white bread, white pasta, and white rice. Look for whole grain options, oat flour or almond flour.  Each meal should contain half fruits/vegetables, one quarter protein, and one quarter carbs (no bigger than a computer mouse).  Cut down on sweet beverages. This includes juice, soda, and sweet tea. Also watch fruit intake, though this is a healthier sweet option, it still contains natural sugar! Limit to 3 servings daily.  Drink at least 1 glass of water with each meal and aim for at least 8 glasses per day  Exercise at least 150 minutes every week.

## 2022-04-15 NOTE — Progress Notes (Signed)
1.  Liver test slightly elevated-this may be fatty liver which could be related to her weight.  We will discuss this more at her follow-up visit. 2.  Thyroid is a little bit undercorrected-verify that she had not missed any doses of her Synthroid.  I am hesitant to increase it until she sees cardiology since increasing the dose can contribute to her palpitations. 3.  WBC a little elevated-but same as it was a year ago.  This may be her normal.  Will discuss labs more at her follow-up appointment.

## 2022-05-02 DIAGNOSIS — B349 Viral infection, unspecified: Secondary | ICD-10-CM | POA: Diagnosis not present

## 2022-05-02 DIAGNOSIS — R519 Headache, unspecified: Secondary | ICD-10-CM | POA: Diagnosis not present

## 2022-05-03 ENCOUNTER — Ambulatory Visit: Payer: BC Managed Care – PPO | Admitting: Family Medicine

## 2022-05-03 ENCOUNTER — Encounter: Payer: Self-pay | Admitting: Family Medicine

## 2022-05-03 VITALS — BP 120/94 | HR 85 | Temp 98.4°F | Ht 61.0 in | Wt 171.4 lb

## 2022-05-03 DIAGNOSIS — G43001 Migraine without aura, not intractable, with status migrainosus: Secondary | ICD-10-CM

## 2022-05-03 DIAGNOSIS — R52 Pain, unspecified: Secondary | ICD-10-CM

## 2022-05-03 DIAGNOSIS — R059 Cough, unspecified: Secondary | ICD-10-CM

## 2022-05-03 DIAGNOSIS — E6609 Other obesity due to excess calories: Secondary | ICD-10-CM | POA: Diagnosis not present

## 2022-05-03 DIAGNOSIS — Z6832 Body mass index (BMI) 32.0-32.9, adult: Secondary | ICD-10-CM

## 2022-05-03 MED ORDER — KETOROLAC TROMETHAMINE 60 MG/2ML IM SOLN
60.0000 mg | Freq: Once | INTRAMUSCULAR | Status: AC
  Administered 2022-05-03: 60 mg via INTRAMUSCULAR

## 2022-05-03 MED ORDER — ZEPBOUND 2.5 MG/0.5ML ~~LOC~~ SOAJ: 2.5000 mg | SUBCUTANEOUS | 0 refills | Status: DC

## 2022-05-03 MED ORDER — UBRELVY 50 MG PO TABS: 50.0000 mg | ORAL_TABLET | Freq: Every day | ORAL | 1 refills | Status: DC | PRN

## 2022-05-03 MED ORDER — KETOROLAC TROMETHAMINE 60 MG/2ML IM SOLN: 60.0000 mg | Freq: Once | INTRAMUSCULAR | Status: AC

## 2022-05-03 MED ORDER — ONDANSETRON HCL 4 MG PO TABS: 4.0000 mg | ORAL_TABLET | Freq: Three times a day (TID) | ORAL | 3 refills | Status: DC | PRN

## 2022-05-03 NOTE — Progress Notes (Signed)
Subjective:     Patient ID: Maria Vasquez, female    DOB: 17-Sep-1980, 42 y.o.   MRN: [id number]  Chief Complaint  Patient presents with   Cough   Generalized Body Aches   Diarrhea   Headache    Sx started last Thursday, went to urgent care on yesterday, tested negative for flu and Covid   Nausea    HPI Since 3/21.Cough for 2 days, myalgias,  Long time, intermitt HA-would last 1-2 days, but now getting worse and can be severe, nausea w/them.  Getting 4-5x/mo.  Gets chills and "weak" w/them.  +photo/phono.  Always on R.  No dbl vision but blurry vision.  Can last 3-5 days. Only vomited once in past. No aura.  Got toradol and decadron on 3/26.  Rx for imitrex 50mg -hasn't filled yet.  Mom has migraine.  No aura.  Vasectomy.   Went to UC yest.  Neg flu and covid.  No diarrhea.    Wt-phentermine in past-but not working. Insurance will cover zepbound.    Health Maintenance Due  Topic Date Due   HIV Screening  Never done   Hepatitis C Screening  Never done   PAP SMEAR-Modifier  Never done    Past Medical History:  Diagnosis Date   Thyroid disease     Past Surgical History:  Procedure Laterality Date   COSMETIC SURGERY  2022   tummy tuck    Outpatient Medications Prior to Visit  Medication Sig Dispense Refill   hydrochlorothiazide (MICROZIDE) 12.5 MG capsule 1 capsule in the morning Orally Once a day for 30 day(s)     levothyroxine (SYNTHROID) 100 MCG tablet 1 tablet in the morning on an empty stomach Orally Once a day for 90 days     SUMAtriptan (IMITREX) 50 MG tablet Take 50 mg by mouth every 2 (two) hours as needed for migraine. May repeat in 2 hours if headache persists or recurs.     Vitamin D, Ergocalciferol, 50000 units CAPS d2     No facility-administered medications prior to visit.    Allergies  Allergen Reactions   Lorcaserin Other (See Comments)   ROS neg/noncontributory except as noted HPI/below      Objective:     BP (!) 120/94 (BP Location: Left  Arm, Patient Position: Sitting, Cuff Size: Large)   Pulse 85   Temp 98.4 F (36.9 C) (Temporal)   Ht 5\' 1"  (1.549 m)   Wt 171 lb 6 oz (77.7 kg)   LMP 03/13/2022 (Exact Date)   SpO2 98%   BMI 32.38 kg/m  Wt Readings from Last 3 Encounters:  05/03/22 171 lb 6 oz (77.7 kg)  04/12/22 169 lb 4 oz (76.8 kg)  06/27/21 199 lb 15.3 oz (90.7 kg)    Physical Exam   Gen: WDWN NAD HEENT: NCAT, conjunctiva not injected, sclera nonicteric TM WNL B, OP moist, no exudates  NECK:  supple, no thyromegaly, no nodes, no carotid bruits CARDIAC: RRR, S1S2+, no murmur. DP 2+B LUNGS: CTAB. No wheezes EXT:  no edema MSK: no gross abnormalities.  NEURO: A&O x3.  CN II-XII intact.  PSYCH: normal mood. Good eye contact     Assessment & Plan:   Problem List Items Addressed This Visit       Cardiovascular and Mediastinum   Migraine without aura and with status migrainosus, not intractable - Primary   Relevant Medications   SUMAtriptan (IMITREX) 50 MG tablet   Ubrogepant (UBRELVY) 50 MG TABS   ketorolac (TORADOL) injection  60 mg   Other Visit Diagnoses     Cough, unspecified type       Generalized body aches       Class 1 obesity due to excess calories without serious comorbidity with body mass index (BMI) of 32.0 to 32.9 in adult       Relevant Medications   tirzepatide (ZEPBOUND) 2.5 MG/0.5ML Pen     1.  Migraine-no aura-more frequent.  Will try magnesium 250 to 500 mg daily for prevention.  Zofran 4 mg every 8 as needed nausea vomiting, Ubrelvy 50 mg daily as needed.  Log and try to find any triggers.  Stay hydrated.  She can also take ibuprofen, Tylenol.  Follow-up in 1 month.  Toradol 60 mg IM given today. 2.  Myalgias-patient thinks that this is due to her migraines.  She had negative COVID and flu test at the urgent care.  Monitor. 3.  Obesity-Zepbound 2.5 mg weekly.  Advised not to get behind the constipation-take Colace 100 mg daily and MiraLAX daily.  Follow-up in 1 month  Meds  ordered this encounter  Medications   ondansetron (ZOFRAN) 4 MG tablet    Sig: Take 1 tablet (4 mg total) by mouth every 8 (eight) hours as needed for nausea or vomiting.    Dispense:  20 tablet    Refill:  3   Ubrogepant (UBRELVY) 50 MG TABS    Sig: Take 1 tablet (50 mg total) by mouth daily as needed.    Dispense:  32 tablet    Refill:  1    Has co-pay card   tirzepatide (ZEPBOUND) 2.5 MG/0.5ML Pen    Sig: Inject 2.5 mg into the skin once a week.    Dispense:  2 mL    Refill:  0   ketorolac (TORADOL) injection 60 mg   ketorolac (TORADOL) injection 60 mg    Wellington Hampshire, MD

## 2022-05-03 NOTE — Patient Instructions (Signed)
Sending the zepbound Take colace 100mg  daily and miralax daily and massage tummy daily.  If diarrhea, then cut back.  Get the co-pay card.    Migraine-take magnesium daily 250-500mg .

## 2022-05-05 ENCOUNTER — Encounter: Payer: Self-pay | Admitting: Family Medicine

## 2022-05-05 DIAGNOSIS — G43001 Migraine without aura, not intractable, with status migrainosus: Secondary | ICD-10-CM | POA: Insufficient documentation

## 2022-05-05 DIAGNOSIS — G43009 Migraine without aura, not intractable, without status migrainosus: Secondary | ICD-10-CM | POA: Insufficient documentation

## 2022-05-09 ENCOUNTER — Other Ambulatory Visit: Payer: Self-pay

## 2022-05-09 ENCOUNTER — Encounter (HOSPITAL_BASED_OUTPATIENT_CLINIC_OR_DEPARTMENT_OTHER): Payer: Self-pay | Admitting: Emergency Medicine

## 2022-05-09 DIAGNOSIS — Z1152 Encounter for screening for COVID-19: Secondary | ICD-10-CM | POA: Diagnosis not present

## 2022-05-09 DIAGNOSIS — G43009 Migraine without aura, not intractable, without status migrainosus: Secondary | ICD-10-CM | POA: Diagnosis not present

## 2022-05-09 DIAGNOSIS — R519 Headache, unspecified: Secondary | ICD-10-CM | POA: Diagnosis not present

## 2022-05-09 DIAGNOSIS — G43909 Migraine, unspecified, not intractable, without status migrainosus: Secondary | ICD-10-CM | POA: Diagnosis not present

## 2022-05-09 LAB — SARS CORONAVIRUS 2 BY RT PCR: SARS Coronavirus 2 by RT PCR: NEGATIVE

## 2022-05-09 NOTE — ED Triage Notes (Signed)
Pt via pov from home with recurrent headaches. She had one last week for several days and now has another headache, which is worse than the ones last week. She has been prescribed sumatriptan but said it was not good for her headache. Pt alert & oriented, nad noted.

## 2022-05-10 ENCOUNTER — Ambulatory Visit: Payer: BC Managed Care – PPO | Admitting: Family Medicine

## 2022-05-10 ENCOUNTER — Emergency Department (HOSPITAL_BASED_OUTPATIENT_CLINIC_OR_DEPARTMENT_OTHER)
Admission: EM | Admit: 2022-05-10 | Discharge: 2022-05-10 | Disposition: A | Discharge status: Home / Self Care | Payer: BC Managed Care – PPO | Attending: Emergency Medicine | Admitting: Emergency Medicine

## 2022-05-10 DIAGNOSIS — G43909 Migraine, unspecified, not intractable, without status migrainosus: Secondary | ICD-10-CM

## 2022-05-10 MED ORDER — KETOROLAC TROMETHAMINE 30 MG/ML IJ SOLN
30.0000 mg | Freq: Once | INTRAMUSCULAR | Status: DC
  Filled 2022-05-10: qty 1

## 2022-05-10 MED ORDER — PROCHLORPERAZINE EDISYLATE 10 MG/2ML IJ SOLN
10.0000 mg | Freq: Once | INTRAMUSCULAR | Status: AC
  Administered 2022-05-10: 10 mg via INTRAVENOUS
  Filled 2022-05-10: qty 2

## 2022-05-10 MED ORDER — KETOROLAC TROMETHAMINE 30 MG/ML IJ SOLN
30.0000 mg | Freq: Once | INTRAMUSCULAR | Status: AC
  Administered 2022-05-10: 30 mg via INTRAVENOUS

## 2022-05-10 MED ORDER — SODIUM CHLORIDE 0.9 % IV BOLUS
1000.0000 mL | Freq: Once | INTRAVENOUS | Status: AC
  Administered 2022-05-10: 1000 mL via INTRAVENOUS

## 2022-05-10 NOTE — ED Provider Notes (Signed)
[facility]  Provider Note  CSN: [id number] Arrival date & time: 05/09/22 2152  History Chief Complaint  Patient presents with   Headache    Maria Vasquez is a 42 y.o. female with history of chronic headaches, always on the right side, multiple per month, usually lasts for 1-2 days and resolved. She has had a headache now since yesterday worse than usual. Associated with photophobia and nausea. She saw PCP last week with Rx for Imitrex and Ubrelvy. She tried the imitrex tonight and reports it did not help. Has not picked up the [place] Rx yet. No fevers.    Home Medications Prior to Admission medications   Medication Sig Start Date End Date Taking? Authorizing Provider  hydrochlorothiazide (MICROZIDE) 12.5 MG capsule 1 capsule in the morning Orally Once a day for 30 day(s) 11/01/21   [provider]  levothyroxine (SYNTHROID) 100 MCG tablet 1 tablet in the morning on an empty stomach Orally Once a day for 90 days    [provider]  ondansetron (ZOFRAN) 4 MG tablet Take 1 tablet (4 mg total) by mouth every 8 (eight) hours as needed for nausea or vomiting. 05/03/22   Tawnya Crook, MD  SUMAtriptan (IMITREX) 50 MG tablet Take 50 mg by mouth every 2 (two) hours as needed for migraine. May repeat in 2 hours if headache persists or recurs.    [provider]  tirzepatide (ZEPBOUND) 2.5 MG/0.5ML Pen Inject 2.5 mg into the skin once a week. 05/03/22   Tawnya Crook, MD  Ubrogepant (UBRELVY) 50 MG TABS Take 1 tablet (50 mg total) by mouth daily as needed. 05/03/22   Tawnya Crook, MD  Vitamin D, Ergocalciferol, 50000 units CAPS d2 11/07/21   [provider]     Allergies    Lorcaserin   Review of Systems   Review of Systems Please see HPI for pertinent positives and negatives  Physical Exam BP 137/87   Pulse 72   Temp 97.6 F (36.4 C)   Resp 18   Ht 5' (1.524 m)   Wt 75.8 kg   LMP 04/26/2022  (Approximate)   SpO2 100%   BMI 32.61 kg/m   Physical Exam Vitals and nursing note reviewed.  Constitutional:      Appearance: Normal appearance.  HENT:     Head: Normocephalic and atraumatic.     Nose: Nose normal.     Mouth/Throat:     Mouth: Mucous membranes are moist.  Eyes:     Extraocular Movements: Extraocular movements intact.     Conjunctiva/sclera: Conjunctivae normal.  Cardiovascular:     Rate and Rhythm: Normal rate.  Pulmonary:     Effort: Pulmonary effort is normal.     Breath sounds: Normal breath sounds.  Abdominal:     General: Abdomen is flat.     Palpations: Abdomen is soft.     Tenderness: There is no abdominal tenderness.  Musculoskeletal:        General: No swelling. Normal range of motion.     Cervical back: Neck supple.  Skin:    General: Skin is warm and dry.  Neurological:     General: No focal deficit present.     Mental Status: She is alert and oriented to person, place, and time.     Cranial Nerves: No cranial nerve deficit.     Sensory: No sensory deficit.     Motor: No weakness.     Gait: Gait  normal.  Psychiatric:        Mood and Affect: Mood normal.     ED Results / Procedures / Treatments   EKG None  Procedures Procedures  Medications Ordered in the ED Medications  prochlorperazine (COMPAZINE) injection 10 mg (10 mg Intravenous Given 05/10/22 0241)  sodium chloride 0.9 % bolus 1,000 mL (1,000 mLs Intravenous New Bag/Given 05/10/22 0246)  ketorolac (TORADOL) 30 MG/ML injection 30 mg (30 mg Intravenous Given 05/10/22 0247)    Initial Impression and Plan  Patient here with recurrent migraine. Will give a migraine cocktail and IVF here and recheck for improvement.   ED Course   Clinical Course as of 05/10/22 0346  Wed May 10, 2022  0227 Covid is neg.  [CS]  [phone] Patient reports improvement in headache. Plan discharge home. Encouraged to try medications prescribed by PCP. Will give referral to Neurology for long term management.  RTED for any other concerns. [CS]    Clinical Course User Index [CS] Truddie Hidden, MD     MDM Rules/Calculators/A&P Medical Decision Making Problems Addressed: Migraine without status migrainosus, not intractable, unspecified migraine type: acute illness or injury  Amount and/or Complexity of Data Reviewed Labs: ordered. Decision-making details documented in ED Course.  Risk Prescription drug management.     Final Clinical Impression(s) / ED Diagnoses Final diagnoses:  Migraine without status migrainosus, not intractable, unspecified migraine type    Rx / DC Orders ED Discharge Orders     None        Truddie Hidden, MD 05/10/22 [phone]

## 2022-05-13 ENCOUNTER — Other Ambulatory Visit: Payer: Self-pay | Admitting: Family Medicine

## 2022-05-17 ENCOUNTER — Encounter: Payer: Self-pay | Admitting: Family Medicine

## 2022-05-17 ENCOUNTER — Ambulatory Visit: Payer: BC Managed Care – PPO | Admitting: Family Medicine

## 2022-05-17 VITALS — BP 130/100 | HR 84 | Temp 97.6°F | Ht 61.0 in | Wt 167.4 lb

## 2022-05-17 DIAGNOSIS — R030 Elevated blood-pressure reading, without diagnosis of hypertension: Secondary | ICD-10-CM

## 2022-05-17 DIAGNOSIS — G43001 Migraine without aura, not intractable, with status migrainosus: Secondary | ICD-10-CM

## 2022-05-17 DIAGNOSIS — Z6832 Body mass index (BMI) 32.0-32.9, adult: Secondary | ICD-10-CM | POA: Diagnosis not present

## 2022-05-17 DIAGNOSIS — E6609 Other obesity due to excess calories: Secondary | ICD-10-CM | POA: Diagnosis not present

## 2022-05-17 MED ORDER — ZEPBOUND 5 MG/0.5ML ~~LOC~~ SOAJ: 5.0000 mg | SUBCUTANEOUS | 0 refills | Status: DC

## 2022-05-17 NOTE — Patient Instructions (Addendum)
Headache prevention-Magnesium 400 mg daily  If get headache-try the ubrelvy.  And motrin and tylenol.   If still headache, sumatriptan(can repeat dose 1-2 hrs).    Keep log of headache  Check blood pressure daily or every other day.  Take the hydrochlorothiazide daily

## 2022-05-17 NOTE — Progress Notes (Signed)
Subjective:     Patient ID: Maria Vasquez, female    DOB: 10-31-1980, 42 y.o.   MRN: [id number]  Chief Complaint  Patient presents with   Follow-up    ED follow-up for migraine on 05/10/22    HPI Was seen in ER on 4/3 for migraine as Imitrex didn't help. Gave compazine, NS, Toradol-improved some Didn't get ubrelvy filled.  Referred to neurology. Also urgent care on 3/26(decadron and Toradol).  Me on 3/27-Toradol. Here for follow up headache(s) per ER.   Always on right.  Was very weak and nausea so went to ER.  Neurology in June.    Weight-doing well on zepbound.  No SE  Blood pressure-not checking.  Stressed yesterday. Not taking meds  Health Maintenance Due  Topic Date Due   HIV Screening  Never done   Hepatitis C Screening  Never done    Past Medical History:  Diagnosis Date   Thyroid disease     Past Surgical History:  Procedure Laterality Date   COSMETIC SURGERY  2022   tummy tuck    Outpatient Medications Prior to Visit  Medication Sig Dispense Refill   hydrochlorothiazide (MICROZIDE) 12.5 MG capsule 1 capsule in the morning Orally Once a day for 30 day(s)     levothyroxine (SYNTHROID) 100 MCG tablet 1 tablet in the morning on an empty stomach Orally Once a day for 90 days     ondansetron (ZOFRAN) 4 MG tablet Take 1 tablet (4 mg total) by mouth every 8 (eight) hours as needed for nausea or vomiting. 20 tablet 3   SUMAtriptan (IMITREX) 50 MG tablet Take 50 mg by mouth every 2 (two) hours as needed for migraine. May repeat in 2 hours if headache persists or recurs.     Ubrogepant (UBRELVY) 50 MG TABS Take 1 tablet (50 mg total) by mouth daily as needed. 32 tablet 1   Vitamin D, Ergocalciferol, 50000 units CAPS d2     tirzepatide (ZEPBOUND) 2.5 MG/0.5ML Pen Inject 2.5 mg into the skin once a week. 2 mL 0   No facility-administered medications prior to visit.    Allergies  Allergen Reactions   Lorcaserin Other (See Comments)   ROS neg/noncontributory except as  noted HPI/below      Objective:     BP (!) 130/100 (BP Location: Left Arm, Patient Position: Sitting, Cuff Size: Large)   Pulse 84   Temp 97.6 F (36.4 C) (Temporal)   Ht 5\' 1"  (1.549 m)   Wt 167 lb 6 oz (75.9 kg)   LMP 04/26/2022 (Approximate)   SpO2 99%   BMI 31.63 kg/m  Wt Readings from Last 3 Encounters:  05/17/22 167 lb 6 oz (75.9 kg)  05/09/22 167 lb (75.8 kg)  05/03/22 171 lb 6 oz (77.7 kg)    Physical Exam   Gen: WDWN NAD HEENT: NCAT, conjunctiva not injected, sclera nonicteric CARDIAC: RRR, S1S2+, no murmur. MSK: no gross abnormalities.  NEURO: A&O x3.  CN II-XII intact.  PSYCH: normal mood. Good eye contact  Reviewed ER chart     Assessment & Plan:   Problem List Items Addressed This Visit       Cardiovascular and Mediastinum   Migraine without aura and with status migrainosus, not intractable - Primary   Relevant Orders   MR Brain Wo Contrast   Other Visit Diagnoses     Class 1 obesity due to excess calories without serious comorbidity with body mass index (BMI) of 32.0 to 32.9 in adult  Relevant Medications   tirzepatide (ZEPBOUND) 5 MG/0.5ML Pen   Elevated blood pressure reading          Migraine-worsening.  Will do MRI brain.  Next headache(s), take ubrelvy and if not help, then add Imitrex.  Tylenol and motrin as well.  Has appointment w/neurology in June.  Also, blood pressure elevated so try hydrochloroTHIAZIDE daily  Elevated blood pressure-not sure if stress, headache(s), hypertension-take hydrochloroTHIAZIDE daily 12.5 mg and monitor blood pressure.   Obesity-doing well on zepbound.  Increase to 5 mg Follow up 1 month(s).   Meds ordered this encounter  Medications   tirzepatide (ZEPBOUND) 5 MG/0.5ML Pen    Sig: Inject 5 mg into the skin once a week.    Dispense:  2 mL    Refill:  0    Angelena Sole, MD

## 2022-05-18 ENCOUNTER — Other Ambulatory Visit: Payer: Self-pay | Admitting: Family Medicine

## 2022-05-18 MED ORDER — SUMATRIPTAN SUCCINATE 50 MG PO TABS: 50.0000 mg | ORAL_TABLET | ORAL | 1 refills | Status: DC | PRN

## 2022-05-18 NOTE — Progress Notes (Signed)
Got faxed request from pharmacy

## 2022-05-24 ENCOUNTER — Other Ambulatory Visit: Payer: Self-pay | Admitting: Family Medicine

## 2022-05-24 ENCOUNTER — Telehealth: Payer: Self-pay | Admitting: Family Medicine

## 2022-05-24 MED ORDER — ZEPBOUND 5 MG/0.5ML ~~LOC~~ SOAJ: 5.0000 mg | SUBCUTANEOUS | 0 refills | Status: DC

## 2022-05-24 MED ORDER — ZEPBOUND 2.5 MG/0.5ML ~~LOC~~ SOAJ: 2.5000 mg | SUBCUTANEOUS | 0 refills | Status: DC

## 2022-05-24 NOTE — Telephone Encounter (Signed)
Caller states pharmacy does not have zepbound 5 mg in stock. States his work pharmacy has 2.5 mg in Insurance account manager requests decreased dosage, 2.5 mg of Zepbound, be sent to [facility] @ [address]. [place], [place] [address].   Caller would also like to know if 5 mg could be sent in when next available. Please Advise.

## 2022-05-24 NOTE — Telephone Encounter (Signed)
Please see message below and advise.

## 2022-05-25 NOTE — Telephone Encounter (Signed)
Noted  

## 2022-05-26 NOTE — Telephone Encounter (Signed)
Pts husband called and states the pharmacy told him they need a PA for the lower dose of meds. Please advise.

## 2022-05-26 NOTE — Telephone Encounter (Signed)
Please assist with completion of PA

## 2022-05-29 ENCOUNTER — Other Ambulatory Visit (HOSPITAL_COMMUNITY): Payer: Self-pay

## 2022-05-29 ENCOUNTER — Ambulatory Visit: Payer: BC Managed Care – PPO | Admitting: Family Medicine

## 2022-05-29 NOTE — Telephone Encounter (Signed)
PA form and chart notes faxed to [employer] at [phone]

## 2022-05-29 NOTE — Telephone Encounter (Signed)
Plan requesting chart notes with documentation of patient's weight management program. Unable to approve without. See below:

## 2022-06-14 ENCOUNTER — Ambulatory Visit: Payer: BC Managed Care – PPO | Admitting: Family Medicine

## 2022-06-14 ENCOUNTER — Encounter: Payer: Self-pay | Admitting: Family Medicine

## 2022-06-14 VITALS — BP 112/80 | HR 70 | Temp 98.0°F | Resp 16 | Ht 61.0 in | Wt 163.5 lb

## 2022-06-14 DIAGNOSIS — E6609 Other obesity due to excess calories: Secondary | ICD-10-CM

## 2022-06-14 DIAGNOSIS — Z6830 Body mass index (BMI) 30.0-30.9, adult: Secondary | ICD-10-CM

## 2022-06-14 DIAGNOSIS — Z79899 Other long term (current) drug therapy: Secondary | ICD-10-CM

## 2022-06-14 DIAGNOSIS — R030 Elevated blood-pressure reading, without diagnosis of hypertension: Secondary | ICD-10-CM | POA: Diagnosis not present

## 2022-06-14 DIAGNOSIS — E038 Other specified hypothyroidism: Secondary | ICD-10-CM

## 2022-06-14 DIAGNOSIS — G43001 Migraine without aura, not intractable, with status migrainosus: Secondary | ICD-10-CM | POA: Diagnosis not present

## 2022-06-14 DIAGNOSIS — D72828 Other elevated white blood cell count: Secondary | ICD-10-CM

## 2022-06-14 LAB — CBC WITH DIFFERENTIAL/PLATELET
Basophils Absolute: 0.1 10*3/uL (ref 0.0–0.1)
Basophils Relative: 0.9 % (ref 0.0–3.0)
Eosinophils Absolute: 0.4 10*3/uL (ref 0.0–0.7)
Eosinophils Relative: 5.7 % — ABNORMAL HIGH (ref 0.0–5.0)
HCT: 37.6 % (ref 36.0–46.0)
Hemoglobin: 13.4 g/dL (ref 12.0–15.0)
Lymphocytes Relative: 29 % (ref 12.0–46.0)
Lymphs Abs: 2 10*3/uL (ref 0.7–4.0)
MCHC: 35.7 g/dL (ref 30.0–36.0)
MCV: 90.7 fl (ref 78.0–100.0)
Monocytes Absolute: 0.5 10*3/uL (ref 0.1–1.0)
Monocytes Relative: 7.5 % (ref 3.0–12.0)
Neutro Abs: 3.8 10*3/uL (ref 1.4–7.7)
Neutrophils Relative %: 56.9 % (ref 43.0–77.0)
Platelets: 425 10*3/uL — ABNORMAL HIGH (ref 150.0–400.0)
RBC: 4.14 Mil/uL (ref 3.87–5.11)
RDW: 13.8 % (ref 11.5–15.5)
WBC: 6.7 10*3/uL (ref 4.0–10.5)

## 2022-06-14 LAB — COMPREHENSIVE METABOLIC PANEL
ALT: 46 U/L — ABNORMAL HIGH (ref 0–35)
AST: 34 U/L (ref 0–37)
Albumin: 3.8 g/dL (ref 3.5–5.2)
Alkaline Phosphatase: 50 U/L (ref 39–117)
BUN: 19 mg/dL (ref 6–23)
CO2: 27 mEq/L (ref 19–32)
Calcium: 9.2 mg/dL (ref 8.4–10.5)
Chloride: 107 mEq/L (ref 96–112)
Creatinine, Ser: 0.78 mg/dL (ref 0.40–1.20)
GFR: 92.78 mL/min (ref >60.00)
Glucose, Bld: 96 mg/dL (ref 70–99)
Potassium: 4.3 mEq/L (ref 3.5–5.1)
Sodium: 141 mEq/L (ref 135–145)
Total Bilirubin: 0.2 mg/dL (ref 0.2–1.2)
Total Protein: 6.4 g/dL (ref 6.0–8.3)

## 2022-06-14 LAB — TSH: TSH: 0.6 u[IU]/mL (ref 0.35–5.50)

## 2022-06-14 MED ORDER — ZEPBOUND 2.5 MG/0.5ML ~~LOC~~ SOAJ: 2.5000 mg | SUBCUTANEOUS | 0 refills | Status: DC

## 2022-06-14 MED ORDER — HYDROCHLOROTHIAZIDE 12.5 MG PO CAPS: ORAL_CAPSULE | ORAL | 1 refills | Status: DC

## 2022-06-14 NOTE — Progress Notes (Signed)
Subjective:     Patient ID: Iveliz Knouse, female    DOB: 1980/09/27, 42 y.o.   MRN: [id number]  Chief Complaint  Patient presents with   Follow-up    1 month follow-up Discuss Zepbound denial Fasting     HPI  Migraine-seeing neurology end of June.  Was better on zepbound 2.5 mg, but now back. Headache(s) since yesterday-ubrelvy helped some but took 2 hrs.  Elevated blood pressure-taking hydrochloroTHIAZIDE 12.5 mg daily.  Blood pressure good and less "swollen feelings" Obesity-zepbound denied.  preDM, fatty liver, elevated blood pressure, helped the headaches. Exercises walks 1 hour 3x/week(s).  Patient nsg assist so moving all day long. Diet-doing keto, no carbohydrates, no sugars.  Did lose few pounds on zepbound 2.5 mg(paid cash), but insurance denied the 5 mg as "didn't qualify".    Health Maintenance Due  Topic Date Due   HIV Screening  Never done   Hepatitis C Screening  Never done    Past Medical History:  Diagnosis Date   Thyroid disease     Past Surgical History:  Procedure Laterality Date   COSMETIC SURGERY  2022   tummy tuck     Current Outpatient Medications:    levothyroxine (SYNTHROID) 100 MCG tablet, 1 tablet in the morning on an empty stomach Orally Once a day for 90 days, Disp: , Rfl:    ondansetron (ZOFRAN) 4 MG tablet, Take 1 tablet (4 mg total) by mouth every 8 (eight) hours as needed for nausea or vomiting., Disp: 20 tablet, Rfl: 3   SUMAtriptan (IMITREX) 50 MG tablet, Take 1 tablet (50 mg total) by mouth every 2 (two) hours as needed for migraine. May repeat in 2 hours if headache persists or recurs., Disp: 10 tablet, Rfl: 1   Ubrogepant (UBRELVY) 50 MG TABS, Take 1 tablet (50 mg total) by mouth daily as needed., Disp: 32 tablet, Rfl: 1   Vitamin D, Ergocalciferol, 50000 units CAPS, d2, Disp: , Rfl:    hydrochlorothiazide (MICROZIDE) 12.5 MG capsule, 1 capsule in the morning Orally Once a day for 30 day(s), Disp: 90 capsule, Rfl: 1   tirzepatide  (ZEPBOUND) 2.5 MG/0.5ML Pen, Inject 2.5 mg into the skin once a week., Disp: 2 mL, Rfl: 0   tirzepatide (ZEPBOUND) 5 MG/0.5ML Pen, Inject 5 mg into the skin once a week. (Patient not taking: Reported on 06/14/2022), Disp: 2 mL, Rfl: 0  Allergies  Allergen Reactions   Lorcaserin Other (See Comments)   ROS neg/noncontributory except as noted HPI/below      Objective:     BP 112/80   Pulse 70   Temp 98 F (36.7 C) (Temporal)   Resp 16   Ht 5\' 1"  (1.549 m)   Wt 163 lb 8 oz (74.2 kg)   SpO2 98%   BMI 30.89 kg/m  Wt Readings from Last 3 Encounters:  06/14/22 163 lb 8 oz (74.2 kg)  05/17/22 167 lb 6 oz (75.9 kg)  05/09/22 167 lb (75.8 kg)    Physical Exam   Gen: WDWN NAD HEENT: NCAT, conjunctiva not injected, sclera nonicteric NECK:  supple, no thyromegaly, no nodes, no carotid bruits CARDIAC: RRR, S1S2+, no murmur. DP 2+B LUNGS: CTAB. No wheezes EXT:  no edema MSK: no gross abnormalities.  NEURO: A&O x3.  CN II-XII intact.  PSYCH: normal mood. Good eye contact     Assessment & Plan:  Other specified hypothyroidism -     TSH  Migraine without aura and with status migrainosus, not intractable  Elevated blood pressure reading  High risk medication use -     CBC with Differential/Platelet -     Comprehensive metabolic panel  Other elevated white blood cell (WBC) count -     CBC with Differential/Platelet  Class 1 obesity due to excess calories with serious comorbidity and body mass index (BMI) of 30.0 to 30.9 in adult  Other orders -     hydroCHLOROthiazide; 1 capsule in the morning Orally Once a day for 30 day(s)  Dispense: 90 capsule; Refill: 1 -     Zepbound; Inject 2.5 mg into the skin once a week.  Dispense: 2 mL; Refill: 0   Hypothyroidism-chronic.  Not ideal.  Not missing doses, will reck TSH Elevated blood pressure-better on hydrochloroTHIAZIDE 12.5 mg daily, continue High risk medications,  check CMP Elevated white blood cell(s)-reck Obesity-with mult  complaining of-morbidities-preDM, gestational diabetes mellitus of pregnancy, elevated blood pressure, fatty liver, migraine headache(s).  Will try to see if PA will go through again.  Zepbound 2.5 mg weekly and increase monthly.  Patient to check on weight loss benefits as well.  Discussed cash pay but still expensive.  Follow up 3 mo  Angelena Sole, MD

## 2022-06-14 NOTE — Patient Instructions (Signed)
Here is a guide to help [place] find out which weight loss medications will be covered by your insurance plan.  Please check out this web site  [employer] and follow the instructions.   There is also a phone number you can call if you do not have access to the Internet. Call [phone] (Monday- Friday 8am-8pm) and an associate will help navigate you.   [employer] provides coverage information for more than 80% of the inquiries submitted!!   Also, Zepbound-[place]

## 2022-06-15 NOTE — Telephone Encounter (Signed)
Error

## 2022-06-15 NOTE — Telephone Encounter (Signed)
Pt states the they were told the PA was denied and the need a Letter of Appeal to be Faxed to [phone]. Please advise.

## 2022-06-16 ENCOUNTER — Telehealth: Payer: Self-pay | Admitting: Pharmacy Technician

## 2022-06-16 ENCOUNTER — Encounter: Payer: Self-pay | Admitting: Family Medicine

## 2022-06-16 NOTE — Telephone Encounter (Signed)
Patient Advocate Encounter  Prior Authorization for Bernita Raisin has been approved with [employer].    Plan quantity limit: 10 tablets per 30 days  Effective dates: 06/13/22 through 12/22/22

## 2022-06-16 NOTE — Telephone Encounter (Signed)
Office notes and letter faxed to: Faxed to [phone].

## 2022-06-17 NOTE — Progress Notes (Signed)
White blood cell(s) better but platelets elevated and hemoglobin not anemia but has decreased.  Take vitamin with iron and will reck at next appointment

## 2022-06-30 ENCOUNTER — Telehealth: Payer: Self-pay | Admitting: Family Medicine

## 2022-06-30 NOTE — Telephone Encounter (Signed)
Patient would like updated Zepbound Appeal

## 2022-07-01 NOTE — Telephone Encounter (Signed)
Pharmacy Patient Advocate Encounter  Received notification from [employer] that the appeal for Zepbound has been denied due to .    Please be advised we currently do not have a Pharmacist to review denials, therefore you will need to process appeals accordingly as needed. Thanks for your support at this time.   You may call [phone], option 3 or fax [phone], to appeal.

## 2022-07-04 NOTE — Telephone Encounter (Signed)
Please see message below

## 2022-07-06 NOTE — Telephone Encounter (Signed)
Patient notified of message below. Patient stated that she was seen at a Bariatric clinic 3 or 4 months ago in [facility]. Patient stated that she will try to get office notes sent to this office so appeal can be done for Zepbound.

## 2022-08-01 ENCOUNTER — Ambulatory Visit: Payer: BC Managed Care – PPO | Admitting: Neurology

## 2022-08-01 ENCOUNTER — Encounter: Payer: Self-pay | Admitting: Neurology

## 2022-08-01 VITALS — BP 139/85 | HR 75 | Ht 61.0 in | Wt 153.0 lb

## 2022-08-01 DIAGNOSIS — G43009 Migraine without aura, not intractable, without status migrainosus: Secondary | ICD-10-CM | POA: Diagnosis not present

## 2022-08-01 DIAGNOSIS — R569 Unspecified convulsions: Secondary | ICD-10-CM | POA: Diagnosis not present

## 2022-08-01 DIAGNOSIS — G40409 Other generalized epilepsy and epileptic syndromes, not intractable, without status epilepticus: Secondary | ICD-10-CM

## 2022-08-01 MED ORDER — UBRELVY 100 MG PO TABS
100.0000 mg | ORAL_TABLET | ORAL | 11 refills | Status: DC | PRN
Start: 2022-08-01 — End: 2022-09-14

## 2022-08-01 NOTE — Patient Instructions (Signed)
EEG MRI brain w/wo contrast seizure protocol Ubrelvy as needed  Migraine Headache A migraine headache is an intense pulsing or throbbing pain on one or both sides of the head. Migraine headaches may also cause other symptoms, such as nausea, vomiting, and sensitivity to light and noise. A migraine headache can last from 4 hours to 3 days. Talk with your health care provider about what things may bring on (trigger) your migraine headaches. What are the causes? The exact cause is not known. However, a migraine may be caused when nerves in the brain get irritated and release chemicals that cause blood vessels to become inflamed. This inflammation causes pain. Migraines may be triggered or caused by: Smoking. Medicines, such as: Nitroglycerin, which is used to treat chest pain. Birth control pills. Estrogen. Certain blood pressure medicines. Foods or drinks that contain nitrates, glutamate, aspartame, MSG, or tyramine. Certain foods or drinks, such as aged cheeses, chocolate, alcohol, or caffeine. Doing physical activity that is very hard. Other triggers may include: Menstruation. Pregnancy. Hunger. Stress. Getting too much or too little sleep. Weather changes. Tiredness (fatigue). What increases the risk? The following factors may make you more likely to have migraine headaches: Being between the ages of 90-21 years old. Being female. Having a family history of migraine headaches. Being Caucasian. Having a mental health condition, such as depression or anxiety. Being obese. What are the signs or symptoms? The main symptom of this condition is pulsing or throbbing pain. This pain may: Happen in any area of the head, such as on one or both sides. Make it hard to do daily activities. Get worse with physical activity. Get worse around bright lights, loud noises, or smells. Other symptoms may include: Nausea. Vomiting. Dizziness. Before a migraine headache starts, you may get  warning signs (an aura). An aura may include: Seeing flashing lights or having blind spots. Seeing bright spots, halos, or zigzag lines. Having tunnel vision or blurred vision. Having numbness or a tingling feeling. Having trouble talking. Having muscle weakness. After a migraine ends, you may have symptoms. These may include: Feeling tired. Trouble concentrating. How is this diagnosed? A migraine headache can be diagnosed based on: Your symptoms. A physical exam. Tests, such as: A CT scan or an MRI of the head. These tests can help rule out other causes of headaches. Taking fluid from the spine (lumbar puncture) to examine it (cerebrospinal fluid analysis, or CSF analysis). How is this treated? This condition may be treated with medicines that: Relieve pain and nausea. Prevent migraines. Treatment may also include: Acupuncture. Lifestyle changes like avoiding foods that trigger migraine headaches. Learning ways to control your body (biofeedback). Talk therapy to help you know and deal with negative thoughts (cognitive behavioral therapy). Follow these instructions at home: Medicines Take over-the-counter and prescription medicines only as told by your provider. Ask your provider if the medicine prescribed to you: Requires you to avoid driving or using machinery. Can cause constipation. You may need to take these actions to prevent or treat constipation: Drink enough fluid to keep your pee (urine) pale yellow. Take over-the-counter or prescription medicines. Eat foods that are high in fiber, such as beans, whole grains, and fresh fruits and vegetables. Limit foods that are high in fat and processed sugars, such as fried or sweet foods. Lifestyle  Do not drink alcohol. Do not use any products that contain nicotine or tobacco. These products include cigarettes, chewing tobacco, and vaping devices, such as e-cigarettes. If you need help quitting, ask  your provider. Get 7-9 hours  of sleep each night, or the amount recommended by your provider. Find ways to manage stress, such as meditation, deep breathing, or yoga. Try to exercise regularly. This can help lessen how bad and how often your migraines occur. General instructions Keep a journal to find out what triggers your migraines, so you can avoid those things. For example, write down: What you eat and drink. How much sleep you get. Any change to your diet or medicines. If you have a migraine headache: Avoid things that make your symptoms worse, such as bright lights. Lie down in a dark, quiet room. Do not drive or use machinery. Ask your provider what activities are safe for you while you have symptoms. Keep all follow-up visits. Your provider will monitor your symptoms and recommend any further treatment. Where to find more information Coalition for Headache and Migraine Patients (CHAMP): headachemigraine.org American Migraine Foundation: americanmigrainefoundation.org National Headache Foundation: headaches.org Contact a health care provider if: You have symptoms that are different or worse than your usual migraine headache symptoms. You have more than 15 days of headaches in one month. Get help right away if: Your migraine headache becomes severe or lasts more than 72 hours. You have a fever or stiff neck. You have vision loss. Your muscles feel weak or like you cannot control them. You lose your balance often or have trouble walking. You faint. You have a seizure. This information is not intended to replace advice given to you by your health care provider. Make sure you discuss any questions you have with your health care provider. Document Revised: 09/19/2021 Document Reviewed: 09/19/2021 Elsevier Patient Education  2024 [employer].

## 2022-08-01 NOTE — Progress Notes (Signed)
[facility]    Provider:  Dr Lucia Gaskins Requesting Provider: Pollyann Savoy, MD Primary Care Provider:  Jeani Sow, MD  CC:  migraines  HPI:  Maria Vasquez is a 42 y.o. female here as requested by Pollyann Savoy, MD for migraines. has Other specified hypothyroidism and Migraine without aura and without status migrainosus, not intractable on their problem list.  Husband is here and provides much information. Started having headaches for 1-2 years.Mother has migraines. Initially mild. Always right, pulsating/pounding/throbbing, light/sound sensitivity, nausea, vomiting. Tylenol did not help. She was revently given sumatriptan. And ubrelvy approved. For last 3 months, 6 migraine days a month, < 10 total headache days a month. No focal deficits with the migraines, no aura, they can last 3-4 days. She was given Bernita Raisin. She takes the [place] right away and the migraine stops. She is here with her husband who provides much information. A year ago her whole body started contrcting, she went to the ED and she was paralyzed, witnessed, arms contracted, did not lose urine or bowels. No other focal neurologic deficits, associated symptoms, inciting events or modifiable factors.  Reviewed notes, labs and imaging from outside physicians, which showed  Tried > 2 months: imitrex(side effects), zofran, tylenol, Bernita Raisin helps tremendously.   Recent Results (from the past 2160 hour(s))  SARS Coronavirus 2 by RT PCR (hospital order, performed in [facility] hospital lab) *cepheid single result test* Anterior Nasal Swab     Status: None   Collection Time: 05/09/22 10:06 PM   Specimen: Anterior Nasal Swab  Result Value Ref Range   SARS Coronavirus 2 by RT PCR NEGATIVE NEGATIVE    Comment: (NOTE) SARS-CoV-2 target nucleic acids are NOT DETECTED.  The SARS-CoV-2 RNA is generally detectable in upper and lower respiratory specimens during the acute phase of infection. The  lowest concentration of SARS-CoV-2 viral copies this assay can detect is 250 copies / mL. A negative result does not preclude SARS-CoV-2 infection and should not be used as the sole basis for treatment or other patient management decisions.  A negative result may occur with improper specimen collection / handling, submission of specimen other than nasopharyngeal swab, presence of viral mutation(s) within the areas targeted by this assay, and inadequate number of viral copies (<250 copies / mL). A negative result must be combined with clinical observations, patient history, and epidemiological information.  Fact Sheet for Patients:   RoadLapTop.co.za  Fact Sheet for Healthcare Providers: http://kim-miller.com/  This test is not yet approved or  cleared by the [place] FDA and has been authorized for detection and/or diagnosis of SARS-CoV-2 by FDA under an Emergency Use Authorization (EUA).  This EUA will remain in effect (meaning this test can be used) for the duration of the COVID-19 declaration under Section 564(b)(1) of the Act, 21 U.S.C. section 360bbb-3(b)(1), unless the authorization is terminated or revoked sooner.  Performed at [facility], [address], [place], [place] [address]   CBC with Differential/Platelet     Status: Abnormal   Collection Time: 06/14/22  9:12 AM  Result Value Ref Range   WBC 6.7 4.0 - 10.5 K/uL   RBC 4.14 3.87 - 5.11 Mil/uL   Hemoglobin 13.4 12.0 - 15.0 g/dL   HCT 74.2 59.5 - 63.8 %   MCV 90.7 78.0 - 100.0 fl   MCHC 35.7 30.0 - 36.0 g/dL   RDW 75.6 43.3 - 29.5 %   Platelets 425.0 (H) 150.0 - 400.0 K/uL   Neutrophils Relative %  56.9 43.0 - 77.0 %   Lymphocytes Relative 29.0 12.0 - 46.0 %   Monocytes Relative 7.5 3.0 - 12.0 %   Eosinophils Relative 5.7 (H) 0.0 - 5.0 %   Basophils Relative 0.9 0.0 - 3.0 %   Neutro Abs 3.8 1.4 - 7.7 K/uL   Lymphs Abs 2.0 0.7 - 4.0 K/uL    Monocytes Absolute 0.5 0.1 - 1.0 K/uL   Eosinophils Absolute 0.4 0.0 - 0.7 K/uL   Basophils Absolute 0.1 0.0 - 0.1 K/uL  Comprehensive metabolic panel     Status: Abnormal   Collection Time: 06/14/22  9:12 AM  Result Value Ref Range   Sodium 141 135 - 145 mEq/L   Potassium 4.3 3.5 - 5.1 mEq/L   Chloride 107 96 - 112 mEq/L   CO2 27 19 - 32 mEq/L   Glucose, Bld 96 70 - 99 mg/dL   BUN 19 6 - 23 mg/dL   Creatinine, Ser 1.61 0.40 - 1.20 mg/dL   Total Bilirubin 0.2 0.2 - 1.2 mg/dL   Alkaline Phosphatase 50 39 - 117 U/L   AST 34 0 - 37 U/L   ALT 46 (H) 0 - 35 U/L   Total Protein 6.4 6.0 - 8.3 g/dL   Albumin 3.8 3.5 - 5.2 g/dL   GFR 09.60 >45.40 mL/min    Comment: Calculated using the CKD-EPI Creatinine Equation (2021)   Calcium 9.2 8.4 - 10.5 mg/dL  TSH     Status: None   Collection Time: 06/14/22  9:12 AM  Result Value Ref Range   TSH 0.60 0.35 - 5.50 uIU/mL     Review of Systems: Patient complains of symptoms per HPI as well as the following symptoms none. Pertinent negatives and positives per HPI. All others negative.   Social History   Socioeconomic History   Marital status: Married    Spouse name: Not on file   Number of children: 2   Years of education: Not on file   Highest education level: Not on file  Occupational History   Occupation: CNA    Comment: [employer]  Tobacco Use   Smoking status: Never   Smokeless tobacco: Never  Vaping Use   Vaping Use: Never used  Substance and Sexual Activity   Alcohol use: Yes    Comment: occ   Drug use: Never   Sexual activity: Yes    Birth control/protection: Surgical    Comment: vasectomy husb  Other Topics Concern   Not on file  Social History Narrative   Not on file   Social Determinants of Health   Financial Resource Strain: Not on file  Food Insecurity: Not on file  Transportation Needs: Not on file  Physical Activity: Not on file  Stress: Not on file  Social Connections: Not on file  Intimate Partner  Violence: Not on file    Family History  Problem Relation Age of Onset   Diabetes Mother    Alcohol abuse Mother    Migraines Mother    Diabetes Father    Alcohol abuse Father     Past Medical History:  Diagnosis Date   Thyroid disease     Patient Active Problem List   Diagnosis Date Noted   Migraine without aura and without status migrainosus, not intractable 05/05/2022   Other specified hypothyroidism 04/12/2022    Past Surgical History:  Procedure Laterality Date   COSMETIC SURGERY  2022   tummy tuck    Current Outpatient Medications  Medication Sig Dispense Refill  hydrochlorothiazide (MICROZIDE) 12.5 MG capsule 1 capsule in the morning Orally Once a day for 30 day(s) 90 capsule 1   levothyroxine (SYNTHROID) 100 MCG tablet 1 tablet in the morning on an empty stomach Orally Once a day for 90 days     ondansetron (ZOFRAN) 4 MG tablet Take 1 tablet (4 mg total) by mouth every 8 (eight) hours as needed for nausea or vomiting. 20 tablet 3   SUMAtriptan (IMITREX) 50 MG tablet Take 1 tablet (50 mg total) by mouth every 2 (two) hours as needed for migraine. May repeat in 2 hours if headache persists or recurs. 10 tablet 1   tirzepatide (ZEPBOUND) 2.5 MG/0.5ML Pen Inject 2.5 mg into the skin once a week. 2 mL 0   tirzepatide (ZEPBOUND) 5 MG/0.5ML Pen Inject 5 mg into the skin once a week. 2 mL 0   Ubrogepant (UBRELVY) 100 MG TABS Take 1 tablet (100 mg total) by mouth every 2 (two) hours as needed. Maximum 200mg  a day. She has a copay card on file 16 tablet 11   Vitamin D, Ergocalciferol, 50000 units CAPS d2     No current facility-administered medications for this visit.    Allergies as of 08/01/2022 - Review Complete 08/01/2022  Allergen Reaction Noted   Lorcaserin Other (See Comments) 04/12/2022    Vitals: BP 139/85   Pulse 75   Ht 5\' 1"  (1.549 m)   Wt 153 lb (69.4 kg)   BMI 28.91 kg/m  Last Weight:  Wt Readings from Last 1 Encounters:  08/01/22 153 lb (69.4 kg)    Last Height:   Ht Readings from Last 1 Encounters:  08/01/22 5\' 1"  (1.549 m)     Physical exam: Exam: Gen: NAD, conversant, well nourised, obese, well groomed                     CV: RRR, no MRG. No Carotid Bruits. No peripheral edema, warm, nontender Eyes: Conjunctivae clear without exudates or hemorrhage  Neuro: Detailed Neurologic Exam  Speech:    Speech is normal; fluent and spontaneous with normal comprehension.  Cognition:    The patient is oriented to person, place, and time;     recent and remote memory intact;     language fluent;     normal attention, concentration,     fund of knowledge Cranial Nerves:    The pupils are equal, round, and reactive to light. The fundi are normal and spontaneous venous pulsations are present. Visual fields are full to finger confrontation. Extraocular movements are intact. Trigeminal sensation is intact and the muscles of mastication are normal. The face is symmetric. The palate elevates in the midline. Hearing intact. Voice is normal. Shoulder shrug is normal. The tongue has normal motion without fasciculations.   Coordination:    Normal finger to nose and heel to shin. Normal rapid alternating movements.   Gait:    Heel-toe and tandem gait are normal.   Motor Observation:    No asymmetry, no atrophy, and no involuntary movements noted. Tone:    Normal muscle tone.    Posture:    Posture is normal. normal erect    Strength:    Strength is V/V in the upper and lower limbs.      Sensation: intact to LT     Reflex Exam:  DTR's:    Deep tendon reflexes in the upper and lower extremities are normal bilaterally.   Toes:    The toes are downgoing bilaterally.  Clonus:    Clonus is absent.    Assessment/Plan:  42 year old with episodic migraines doing well on [place]. She had a seizure-like event needs evaluation  Seizure-like event: EEG and MRI brain w/wo contrast seizure protocol Episodic migraine: Bernita Raisin as  needed  Orders Placed This Encounter  Procedures   MR BRAIN W WO CONTRAST   EEG adult   Meds ordered this encounter  Medications   Ubrogepant (UBRELVY) 100 MG TABS    Sig: Take 1 tablet (100 mg total) by mouth every 2 (two) hours as needed. Maximum 200mg  a day. She has a copay card on file    Dispense:  16 tablet    Refill:  11   Discussed: To prevent or relieve headaches, try the following: Cool Compress. Lie down and place a cool compress on your head.  Avoid headache triggers. If certain foods or odors seem to have triggered your migraines in the past, avoid them. A headache diary might help you identify triggers.  Include physical activity in your daily routine. Try a daily walk or other moderate aerobic exercise.  Manage stress. Find healthy ways to cope with the stressors, such as delegating tasks on your to-do list.  Practice relaxation techniques. Try deep breathing, yoga, massage and visualization.  Eat regularly. Eating regularly scheduled meals and maintaining a healthy diet might help prevent headaches. Also, drink plenty of fluids.  Follow a regular sleep schedule. Sleep deprivation might contribute to headaches Consider biofeedback. With this mind-body technique, you learn to control certain bodily functions -- such as muscle tension, heart rate and blood pressure -- to prevent headaches or reduce headache pain.    Proceed to emergency room if you experience new or worsening symptoms or symptoms do not resolve, if you have new neurologic symptoms or if headache is severe, or for any concerning symptom.   Provided education and documentation from American headache Society toolbox  and literature from other sources including articles on: chronic migraine medication overuse headache, chronic migraines, prevention of migraines, behavioral and other nonpharmacologic treatments for headache and other. Provided migraine education.    Cc: Pollyann Savoy, MD,  Jeani Sow,  MD  Naomie Dean, MD  [facility] [address] [place], [place] [address]  Phone [phone] Fax [phone]

## 2022-08-02 ENCOUNTER — Telehealth: Payer: Self-pay | Admitting: Neurology

## 2022-08-02 ENCOUNTER — Other Ambulatory Visit: Payer: Self-pay | Admitting: Family Medicine

## 2022-08-02 NOTE — Telephone Encounter (Signed)
Pt scheduled for 60 mins MR brain w/wo contrast at [facility] for 08/16/22 at [facility] auth# [id number] (08/02/22-08/31/22)

## 2022-08-02 NOTE — Telephone Encounter (Signed)
Patient was called back and got scheduled.

## 2022-08-02 NOTE — Telephone Encounter (Signed)
Spoke to patient and she stated that she is good on the 5 mg. Rx sent to the pharmacy.

## 2022-08-02 NOTE — Telephone Encounter (Signed)
Pt husband called. Requesting a call back to schedule pt MRI.

## 2022-08-03 NOTE — Telephone Encounter (Signed)
[employer] company to do appeal, they stated that appeal has been done and denied, the only thing left to do is a peer to peer with provider. Paperwork that patient dropped off is on provider's desk.

## 2022-08-03 NOTE — Telephone Encounter (Signed)
Patient's spouse dropped off visit notes from [facility]ariat[facility] clinic . Notes placed in in providers folder

## 2022-08-07 NOTE — Telephone Encounter (Signed)
Patient's spouse called stating he got call in regards to patient's paperwork that he dropped off. States he is available to answer any needed questions. Spouse is on the [facility].

## 2022-08-15 ENCOUNTER — Other Ambulatory Visit: Payer: BC Managed Care – PPO | Admitting: *Deleted

## 2022-08-16 ENCOUNTER — Ambulatory Visit (INDEPENDENT_AMBULATORY_CARE_PROVIDER_SITE_OTHER): Payer: BC Managed Care – PPO

## 2022-08-16 DIAGNOSIS — G40409 Other generalized epilepsy and epileptic syndromes, not intractable, without status epilepticus: Secondary | ICD-10-CM

## 2022-08-16 DIAGNOSIS — R569 Unspecified convulsions: Secondary | ICD-10-CM | POA: Diagnosis not present

## 2022-08-16 MED ORDER — GADOBENATE DIMEGLUMINE 529 MG/ML IV SOLN
15.0000 mL | Freq: Once | INTRAVENOUS | Status: AC | PRN
Start: 2022-08-16 — End: 2022-08-16
  Administered 2022-08-16: 15 mL via INTRAVENOUS

## 2022-08-17 ENCOUNTER — Other Ambulatory Visit: Payer: Self-pay | Admitting: Family Medicine

## 2022-08-17 NOTE — Progress Notes (Signed)
Spoke w/peer to peer review.  Pt has been in wt loss progrm 10/02/18 to 04/19/22.  Doing well on zepbound.  Will approve.

## 2022-08-20 ENCOUNTER — Encounter: Payer: Self-pay | Admitting: Family Medicine

## 2022-08-21 ENCOUNTER — Other Ambulatory Visit: Payer: Self-pay | Admitting: Family Medicine

## 2022-08-21 ENCOUNTER — Other Ambulatory Visit (HOSPITAL_BASED_OUTPATIENT_CLINIC_OR_DEPARTMENT_OTHER): Payer: Self-pay

## 2022-08-21 MED ORDER — ZEPBOUND 7.5 MG/0.5ML ~~LOC~~ SOAJ
7.5000 mg | SUBCUTANEOUS | 0 refills | Status: DC
  Filled 2022-08-21: qty 2, 28d supply, fill #0

## 2022-08-25 ENCOUNTER — Other Ambulatory Visit (HOSPITAL_BASED_OUTPATIENT_CLINIC_OR_DEPARTMENT_OTHER): Payer: Self-pay

## 2022-08-30 ENCOUNTER — Other Ambulatory Visit: Payer: BC Managed Care – PPO | Admitting: *Deleted

## 2022-09-06 ENCOUNTER — Other Ambulatory Visit: Payer: BC Managed Care – PPO | Admitting: *Deleted

## 2022-09-08 ENCOUNTER — Other Ambulatory Visit: Payer: Self-pay | Admitting: Family Medicine

## 2022-09-14 ENCOUNTER — Encounter: Payer: Self-pay | Admitting: Family Medicine

## 2022-09-14 ENCOUNTER — Ambulatory Visit: Payer: BC Managed Care – PPO | Admitting: Family Medicine

## 2022-09-14 VITALS — BP 110/80 | HR 69 | Temp 98.2°F | Resp 18 | Ht 61.0 in | Wt 153.4 lb

## 2022-09-14 DIAGNOSIS — Z6830 Body mass index (BMI) 30.0-30.9, adult: Secondary | ICD-10-CM

## 2022-09-14 DIAGNOSIS — E6609 Other obesity due to excess calories: Secondary | ICD-10-CM

## 2022-09-14 DIAGNOSIS — K760 Fatty (change of) liver, not elsewhere classified: Secondary | ICD-10-CM | POA: Diagnosis not present

## 2022-09-14 DIAGNOSIS — Z79899 Other long term (current) drug therapy: Secondary | ICD-10-CM | POA: Diagnosis not present

## 2022-09-14 DIAGNOSIS — R030 Elevated blood-pressure reading, without diagnosis of hypertension: Secondary | ICD-10-CM | POA: Diagnosis not present

## 2022-09-14 LAB — CBC WITH DIFFERENTIAL/PLATELET
Basophils Absolute: 0.1 10*3/uL (ref 0.0–0.1)
Basophils Relative: 1 % (ref 0.0–3.0)
Eosinophils Absolute: 0.2 10*3/uL (ref 0.0–0.7)
Eosinophils Relative: 3.2 % (ref 0.0–5.0)
HCT: 38.8 % (ref 36.0–46.0)
Hemoglobin: 13.4 g/dL (ref 12.0–15.0)
Lymphocytes Relative: 29.9 % (ref 12.0–46.0)
Lymphs Abs: 1.9 10*3/uL (ref 0.7–4.0)
MCHC: 34.5 g/dL (ref 30.0–36.0)
MCV: 90.1 fl (ref 78.0–100.0)
Monocytes Absolute: 0.5 10*3/uL (ref 0.1–1.0)
Monocytes Relative: 8.2 % (ref 3.0–12.0)
Neutro Abs: 3.7 10*3/uL (ref 1.4–7.7)
Neutrophils Relative %: 57.7 % (ref 43.0–77.0)
Platelets: 408 10*3/uL — ABNORMAL HIGH (ref 150.0–400.0)
RBC: 4.31 Mil/uL (ref 3.87–5.11)
RDW: 14.2 % (ref 11.5–15.5)
WBC: 6.4 10*3/uL (ref 4.0–10.5)

## 2022-09-14 LAB — COMPREHENSIVE METABOLIC PANEL
ALT: 27 U/L (ref 0–35)
AST: 22 U/L (ref 0–37)
Albumin: 4.6 g/dL (ref 3.5–5.2)
Alkaline Phosphatase: 49 U/L (ref 39–117)
BUN: 14 mg/dL (ref 6–23)
CO2: 26 mEq/L (ref 19–32)
Calcium: 9.5 mg/dL (ref 8.4–10.5)
Chloride: 103 mEq/L (ref 96–112)
Creatinine, Ser: 0.79 mg/dL (ref 0.40–1.20)
GFR: 91.21 mL/min (ref >60.00)
Glucose, Bld: 88 mg/dL (ref 70–99)
Potassium: 3.4 mEq/L — ABNORMAL LOW (ref 3.5–5.1)
Sodium: 139 mEq/L (ref 135–145)
Total Bilirubin: 0.6 mg/dL (ref 0.2–1.2)
Total Protein: 7.3 g/dL (ref 6.0–8.3)

## 2022-09-14 MED ORDER — ZEPBOUND 10 MG/0.5ML ~~LOC~~ SOAJ
10.0000 mg | SUBCUTANEOUS | 0 refills | Status: DC
Start: 2022-09-14 — End: 2022-12-11

## 2022-09-14 NOTE — Patient Instructions (Signed)
It was very nice to see you today!  Keep up the great work!   PLEASE NOTE:  If you had any lab tests please let us know if you have not heard back within a few days. You may see your results on MyChart before we have a chance to review them but we will give you a call once they are reviewed by us. If we ordered any referrals today, please let us know if you have not heard from their office within the next week.   Please try these tips to maintain a healthy lifestyle:  Eat most of your calories during the day when you are active. Eliminate processed foods including packaged sweets (pies, cakes, cookies), reduce intake of potatoes, white bread, white pasta, and white rice. Look for whole grain options, oat flour or almond flour.  Each meal should contain half fruits/vegetables, one quarter protein, and one quarter carbs (no bigger than a computer mouse).  Cut down on sweet beverages. This includes juice, soda, and sweet tea. Also watch fruit intake, though this is a healthier sweet option, it still contains natural sugar! Limit to 3 servings daily.  Drink at least 1 glass of water with each meal and aim for at least 8 glasses per day  Exercise at least 150 minutes every week.   

## 2022-09-14 NOTE — Progress Notes (Signed)
Liver improved!!!   Potassium a little low-take OTC potassium tab

## 2022-09-14 NOTE — Progress Notes (Signed)
Subjective:     Patient ID: Maria Vasquez, female    DOB: August 22, 1980, 42 y.o.   MRN: [id number]  Chief Complaint  Patient presents with   Medical Management of Chronic Issues    3 month follow-up  Fasting Discuss veins in both legs    HPI  PreDM - She has been compliant with Zepbound 7.5 mg. She is tolerating Zepbound well and denies any negative side effects. Working on exercise and diet. Drinking more water and staying hydrated. States water is all she drinks. She was 167 lbs in 05/2022 and is 153 lbs today.   Blood pressure - Pt is on hydrochlorothiazide 12.5 mg. She has not been monitoring her blood pressure at home. Given that she has long shifts at work, she usually finds it difficult to check it after work. She reports having an episode of dizziness this week, which is not usual for her. No cp/palp/edema/cough/sob.   Health Maintenance Due  Topic Date Due   HIV Screening  Never done   Hepatitis C Screening  Never done    Past Medical History:  Diagnosis Date   Thyroid disease     Past Surgical History:  Procedure Laterality Date   COSMETIC SURGERY  2022   tummy tuck     Current Outpatient Medications:    hydrochlorothiazide (MICROZIDE) 12.5 MG capsule, 1 capsule in the morning Orally Once a day for 30 day(s), Disp: 90 capsule, Rfl: 1   levothyroxine (SYNTHROID) 100 MCG tablet, 1 tablet in the morning on an empty stomach Orally Once a day for 90 days, Disp: , Rfl:    ondansetron (ZOFRAN) 4 MG tablet, Take 1 tablet (4 mg total) by mouth every 8 (eight) hours as needed for nausea or vomiting., Disp: 20 tablet, Rfl: 3   SUMAtriptan (IMITREX) 50 MG tablet, Take 1 tablet (50 mg total) by mouth every 2 (two) hours as needed for migraine. May repeat in 2 hours if headache persists or recurs., Disp: 10 tablet, Rfl: 1   tirzepatide (ZEPBOUND) 10 MG/0.5ML Pen, Inject 10 mg into the skin once a week., Disp: 6 mL, Rfl: 0   UBRELVY 50 MG TABS, Take by mouth., Disp: , Rfl:     Vitamin D, Ergocalciferol, 50000 units CAPS, d2, Disp: , Rfl:   Allergies  Allergen Reactions   Lorcaserin Other (See Comments)   ROS neg/noncontributory except as noted HPI/below      Objective:     BP 110/80   Pulse 69   Temp 98.2 F (36.8 C) (Temporal)   Resp 18   Ht 5\' 1"  (1.549 m)   Wt 153 lb 6 oz (69.6 kg)   LMP 09/13/2022 (Exact Date)   SpO2 97%   BMI 28.98 kg/m  Wt Readings from Last 3 Encounters:  09/14/22 153 lb 6 oz (69.6 kg)  08/01/22 153 lb (69.4 kg)  06/14/22 163 lb 8 oz (74.2 kg)    Physical Exam   Gen: WDWN NAD HEENT: NCAT, conjunctiva not injected, sclera nonicteric CARDIAC: RRR, S1S2+, no murmur. DP 2+B LUNGS: CTAB. No wheezes ABD: BS +, soft, NTND no HSM no masses MSK: no gross abnormalities.  NEURO: A&O x3.  CN II-XII intact.  PSYCH: normal mood. Good eye contact      Assessment & Plan:  Elevated blood pressure reading  NAFLD (nonalcoholic fatty liver disease)  Class 1 obesity due to excess calories with serious comorbidity and body mass index (BMI) of 30.0 to 30.9 in adult -  Zepbound; Inject 10 mg into the skin once a week.  Dispense: 6 mL; Refill: 0  High risk medication use -     CBC with Differential/Platelet -     Comprehensive metabolic panel  1  elevated bp-chronic  controlled.  Continue hydrochlorothiazide 12.5mg  daily.  Check labs 2.  NAFLD-chronic.  Fair control.  Working on diet/exercise.  Doing well on zepbound but plateaued wt so will increase to 10mg  weekly 3.  Obesity-chronic.  Has been working on diet exercise.  Losing wt on zepbound but plateaued.  Increase to 10mg  weekly.   Return in about 3 months (around 12/15/2022) for HTN, wt.     I,Rachel Rivera,acting as a scribe for Angelena Sole, MD.,have documented all relevant documentation on the behalf of Angelena Sole, MD,as directed by  Angelena Sole, MD while in the presence of Angelena Sole, MD.  I, Angelena Sole, MD, have reviewed all documentation for this visit. The  documentation on 09/14/22 for the exam, diagnosis, procedures, and orders are all accurate and complete.   Angelena Sole, MD

## 2022-12-06 DIAGNOSIS — K2900 Acute gastritis without bleeding: Secondary | ICD-10-CM | POA: Diagnosis not present

## 2022-12-10 ENCOUNTER — Other Ambulatory Visit: Payer: Self-pay | Admitting: Family Medicine

## 2022-12-10 DIAGNOSIS — E6609 Other obesity due to excess calories: Secondary | ICD-10-CM

## 2022-12-14 ENCOUNTER — Other Ambulatory Visit: Payer: Self-pay

## 2022-12-14 ENCOUNTER — Encounter: Payer: Self-pay | Admitting: Family Medicine

## 2022-12-14 ENCOUNTER — Other Ambulatory Visit (HOSPITAL_BASED_OUTPATIENT_CLINIC_OR_DEPARTMENT_OTHER): Payer: Self-pay

## 2022-12-14 ENCOUNTER — Ambulatory Visit: Payer: BC Managed Care – PPO | Admitting: Family Medicine

## 2022-12-14 VITALS — BP 117/86 | HR 78 | Temp 98.1°F | Resp 16 | Ht 61.0 in | Wt 148.2 lb

## 2022-12-14 DIAGNOSIS — R1012 Left upper quadrant pain: Secondary | ICD-10-CM

## 2022-12-14 DIAGNOSIS — E66811 Obesity, class 1: Secondary | ICD-10-CM

## 2022-12-14 DIAGNOSIS — E038 Other specified hypothyroidism: Secondary | ICD-10-CM

## 2022-12-14 DIAGNOSIS — F4321 Adjustment disorder with depressed mood: Secondary | ICD-10-CM

## 2022-12-14 DIAGNOSIS — G43009 Migraine without aura, not intractable, without status migrainosus: Secondary | ICD-10-CM | POA: Diagnosis not present

## 2022-12-14 DIAGNOSIS — I10 Essential (primary) hypertension: Secondary | ICD-10-CM | POA: Diagnosis not present

## 2022-12-14 DIAGNOSIS — R030 Elevated blood-pressure reading, without diagnosis of hypertension: Secondary | ICD-10-CM

## 2022-12-14 DIAGNOSIS — Z6828 Body mass index (BMI) 28.0-28.9, adult: Secondary | ICD-10-CM

## 2022-12-14 DIAGNOSIS — E6609 Other obesity due to excess calories: Secondary | ICD-10-CM

## 2022-12-14 DIAGNOSIS — K760 Fatty (change of) liver, not elsewhere classified: Secondary | ICD-10-CM | POA: Diagnosis not present

## 2022-12-14 LAB — LIPID PANEL
Cholesterol: 182 mg/dL (ref 0–200)
HDL: 60.3 mg/dL (ref >39.00)
LDL Cholesterol: 94 mg/dL (ref 0–99)
NonHDL: 122.14
Total CHOL/HDL Ratio: 3
Triglycerides: 140 mg/dL (ref 0.0–149.0)
VLDL: 28 mg/dL (ref 0.0–40.0)

## 2022-12-14 LAB — COMPREHENSIVE METABOLIC PANEL
ALT: 37 U/L — ABNORMAL HIGH (ref 0–35)
AST: 21 U/L (ref 0–37)
Albumin: 4.3 g/dL (ref 3.5–5.2)
Alkaline Phosphatase: 55 U/L (ref 39–117)
BUN: 22 mg/dL (ref 6–23)
CO2: 28 meq/L (ref 19–32)
Calcium: 9.6 mg/dL (ref 8.4–10.5)
Chloride: 105 meq/L (ref 96–112)
Creatinine, Ser: 0.68 mg/dL (ref 0.40–1.20)
GFR: 106.01 mL/min (ref >60.00)
Glucose, Bld: 83 mg/dL (ref 70–99)
Potassium: 3.7 meq/L (ref 3.5–5.1)
Sodium: 141 meq/L (ref 135–145)
Total Bilirubin: 0.5 mg/dL (ref 0.2–1.2)
Total Protein: 7 g/dL (ref 6.0–8.3)

## 2022-12-14 LAB — CBC WITH DIFFERENTIAL/PLATELET
Basophils Absolute: 0.1 10*3/uL (ref 0.0–0.1)
Basophils Relative: 0.9 % (ref 0.0–3.0)
Eosinophils Absolute: 0.3 10*3/uL (ref 0.0–0.7)
Eosinophils Relative: 3.6 % (ref 0.0–5.0)
HCT: 39.9 % (ref 36.0–46.0)
Hemoglobin: 13.5 g/dL (ref 12.0–15.0)
Lymphocytes Relative: 20.2 % (ref 12.0–46.0)
Lymphs Abs: 1.4 10*3/uL (ref 0.7–4.0)
MCHC: 33.9 g/dL (ref 30.0–36.0)
MCV: 92.7 fL (ref 78.0–100.0)
Monocytes Absolute: 0.6 10*3/uL (ref 0.1–1.0)
Monocytes Relative: 8 % (ref 3.0–12.0)
Neutro Abs: 4.8 10*3/uL (ref 1.4–7.7)
Neutrophils Relative %: 67.3 % (ref 43.0–77.0)
Platelets: 402 10*3/uL — ABNORMAL HIGH (ref 150.0–400.0)
RBC: 4.31 Mil/uL (ref 3.87–5.11)
RDW: 13.9 % (ref 11.5–15.5)
WBC: 7.1 10*3/uL (ref 4.0–10.5)

## 2022-12-14 LAB — POC URINALSYSI DIPSTICK (AUTOMATED)
Bilirubin, UA: NEGATIVE
Blood, UA: POSITIVE
Glucose, UA: NEGATIVE
Ketones, UA: NEGATIVE
Leukocytes, UA: NEGATIVE
Nitrite, UA: NEGATIVE
Protein, UA: NEGATIVE
Spec Grav, UA: >=1.03 — AB (ref 1.010–1.025)
Urobilinogen, UA: 0.2 U/dL
pH, UA: 5.5 (ref 5.0–8.0)

## 2022-12-14 LAB — HEMOGLOBIN A1C: Hgb A1c MFr Bld: 5.1 % (ref 4.6–6.5)

## 2022-12-14 LAB — AMYLASE: Amylase: 50 U/L (ref 27–131)

## 2022-12-14 LAB — TSH: TSH: 4.41 u[IU]/mL (ref 0.35–5.50)

## 2022-12-14 LAB — LIPASE: Lipase: 37 U/L (ref 11.0–59.0)

## 2022-12-14 MED ORDER — HYDROCHLOROTHIAZIDE 12.5 MG PO CAPS
ORAL_CAPSULE | ORAL | 1 refills | Status: DC
Start: 2022-12-14 — End: 2022-12-14
  Filled 2022-12-14: qty 90, fill #0

## 2022-12-14 MED ORDER — BUPROPION HCL ER (XL) 150 MG PO TB24
150.0000 mg | ORAL_TABLET | Freq: Every day | ORAL | 1 refills | Status: DC
Start: 2022-12-14 — End: 2023-01-11

## 2022-12-14 MED ORDER — PANTOPRAZOLE SODIUM 40 MG PO TBEC
40.0000 mg | DELAYED_RELEASE_TABLET | Freq: Every day | ORAL | 1 refills | Status: DC
  Filled 2022-12-14: qty 30, 30d supply, fill #0

## 2022-12-14 MED ORDER — ZEPBOUND 10 MG/0.5ML ~~LOC~~ SOAJ
10.0000 mg | SUBCUTANEOUS | 1 refills | Status: DC
Start: 2022-12-14 — End: 2023-05-22

## 2022-12-14 MED ORDER — PANTOPRAZOLE SODIUM 40 MG PO TBEC
40.0000 mg | DELAYED_RELEASE_TABLET | Freq: Every day | ORAL | 1 refills | Status: DC
Start: 2022-12-14 — End: 2023-05-30

## 2022-12-14 MED ORDER — LEVOTHYROXINE SODIUM 100 MCG PO TABS
100.0000 ug | ORAL_TABLET | Freq: Every day | ORAL | 1 refills | Status: DC
Start: 2022-12-14 — End: 2022-12-14
  Filled 2022-12-14: qty 90, 90d supply, fill #0

## 2022-12-14 MED ORDER — ZEPBOUND 10 MG/0.5ML ~~LOC~~ SOAJ
10.0000 mg | SUBCUTANEOUS | 1 refills | Status: DC
Start: 2022-12-14 — End: 2022-12-14
  Filled 2022-12-14: qty 6, 84d supply, fill #0

## 2022-12-14 MED ORDER — HYDROCHLOROTHIAZIDE 12.5 MG PO CAPS: ORAL_CAPSULE | ORAL | 1 refills | Status: DC

## 2022-12-14 MED ORDER — BUPROPION HCL ER (XL) 150 MG PO TB24
150.0000 mg | ORAL_TABLET | Freq: Every day | ORAL | 1 refills | Status: DC
Start: 2022-12-14 — End: 2022-12-14
  Filled 2022-12-14: qty 30, 30d supply, fill #0

## 2022-12-14 MED ORDER — LEVOTHYROXINE SODIUM 100 MCG PO TABS: 100.0000 ug | ORAL_TABLET | Freq: Every day | ORAL | 1 refills | Status: DC

## 2022-12-14 NOTE — Assessment & Plan Note (Signed)
Chronic.  Controlled.  Continue synthroid 0.1mg  daily

## 2022-12-14 NOTE — Assessment & Plan Note (Signed)
Chronic.  Controlled on hydrochlorothiazide 12.5 mg daily.

## 2022-12-14 NOTE — Progress Notes (Signed)
Labs are great except 1.  Thyroid-has she missed any doses?  If not, can continue same or increase to 112 and repeat 2 months 2.  Some blood in urine-any vaginal spotting?  Need to consider kidney stone.  If abd pain is still bad, we can send for stat ct abd/pelvis-stone protocol so no contrast

## 2022-12-14 NOTE — Progress Notes (Signed)
Subjective:     Patient ID: Maria Vasquez, female    DOB: Oct 20, 1980, 42 y.o.   MRN: [id number]  Chief Complaint  Patient presents with   Medical Management of Chronic Issues    3 month follow-up on htn, weight Fasting Having some depression   Abdominal Pain    Left sided abdominal pain that started 2 weeks ago, went to UC, still having pain    HPI  PreDM - Working on diet and exercise. Has been on 10 mg of Zepbound for about 3 months. Takes Zofran for first 2 days after Zepbound, to help with nausea.   HTN - Pt is on Hydrochlorothiazide 12.5 mg once daily in the morning. Tolerating well. Has BP cuff at home, but has not been checking. No ha/dizziness/cp/palp/edema/cough/sob.   Lower left abdominal pain - She complains of left upper and lower abdominal pain starting about 2 weeks ago. Reports vomiting for 3 days, last episode was 3 days ago. Presented to the UC last week when vomiting started. Pain is intermittent, sharp, and burning sensation that has worsened in the last 2 weeks. Pain is non radiating. No changes to pain with eating. Also endorses production of green mucus and left-sided abdominal swelling at times. Pain does not wake her when sleeping. Vomiting has resolved, but pain has persisted. Denies any heartburn, diarrhea, constipation, dysuria, or any color-changes to bowel movements. No other family members have similar sx. Does not drink alcohol. Has some periods, but notes they are irregular and bleeding only lasts about 2 days. LMP 10/25.  Depression - She reports seasonal depression starting about 1 week ago. Endorses difficulty getting out the bed, going outside, not wanting to do anything, and just wanting to be alone. Reports that she can usually manage her emotions and get by, but has recently been struggling. Typically occurs around this time due to the holidays approaching and not having her family nearby. All her family lives in [place]. States she had to call out of  work yesterday due to these sx. Has had this issue before and was treated with medication many years which helped. States she cannot recall the name of the medication. No SI.   Migraines - Well controlled. Reports she has not had a migraine since her last visit.     Health Maintenance Due  Topic Date Due   Cervical Cancer Screening (HPV/Pap Cotest)  Never done    Past Medical History:  Diagnosis Date   Thyroid disease     Past Surgical History:  Procedure Laterality Date   COSMETIC SURGERY  2022   tummy tuck     Current Outpatient Medications:    ondansetron (ZOFRAN) 4 MG tablet, Take 1 tablet (4 mg total) by mouth every 8 (eight) hours as needed for nausea or vomiting., Disp: 20 tablet, Rfl: 3   SUMAtriptan (IMITREX) 50 MG tablet, Take 1 tablet (50 mg total) by mouth every 2 (two) hours as needed for migraine. May repeat in 2 hours if headache persists or recurs., Disp: 10 tablet, Rfl: 1   UBRELVY 50 MG TABS, Take by mouth., Disp: , Rfl:    Vitamin D, Ergocalciferol, 50000 units CAPS, d2, Disp: , Rfl:    buPROPion (WELLBUTRIN XL) 150 MG 24 hr tablet, Take 1 tablet (150 mg total) by mouth daily., Disp: 30 tablet, Rfl: 1   hydrochlorothiazide (MICROZIDE) 12.5 MG capsule, 1 capsule in the morning Orally Once a day for 30 day(s), Disp: 90 capsule, Rfl: 1   levothyroxine (  SYNTHROID) 100 MCG tablet, Take 1 tablet (100 mcg total) by mouth daily before breakfast., Disp: 90 tablet, Rfl: 1   pantoprazole (PROTONIX) 40 MG tablet, Take 1 tablet (40 mg total) by mouth daily., Disp: 30 tablet, Rfl: 1   tirzepatide (ZEPBOUND) 10 MG/0.5ML Pen, Inject 10 mg into the skin once a week., Disp: 6 mL, Rfl: 1  Allergies  Allergen Reactions   Lorcaserin Other (See Comments)   ROS neg/noncontributory except as noted HPI/below      Objective:     BP 117/86   Pulse 78   Temp 98.1 F (36.7 C) (Temporal)   Resp 16   Ht 5\' 1"  (1.549 m)   Wt 148 lb 4 oz (67.2 kg)   LMP 12/01/2022 (Exact Date)    SpO2 100%   BMI 28.01 kg/m  Wt Readings from Last 3 Encounters:  12/14/22 148 lb 4 oz (67.2 kg)  09/14/22 153 lb 6 oz (69.6 kg)  08/01/22 153 lb (69.4 kg)    Physical Exam   Gen: WDWN NAD HEENT: NCAT, conjunctiva not injected, sclera nonicteric NECK:  supple, no thyromegaly, no nodes, no carotid bruits CARDIAC: RRR, S1S2+, no murmur. DP 2+B LUNGS: CTAB. No wheezes ABDOMEN:  BS+, soft, +Slight tenderness on whole left abdomen and suprapubic, No HSM, no masses.  EXT:  no edema MSK: no gross abnormalities.  NEURO: A&O x3.  CN II-XII intact.  PSYCH: normal mood. Good eye contact   Results for orders placed or performed in visit on 12/14/22  POCT Urinalysis Dipstick (Automated)  Result Value Ref Range   Color, UA YELLOW    Clarity, UA CLEAR    Glucose, UA Negative Negative   Bilirubin, UA NEG    Ketones, UA NEG    Spec Grav, UA >=1.030 (A) 1.010 - 1.025   Blood, UA POS    pH, UA 5.5 5.0 - 8.0   Protein, UA Negative Negative   Urobilinogen, UA 0.2 0.2 or 1.0 E.U./dL   Nitrite, UA NEG    Leukocytes, UA Negative Negative       Assessment & Plan:  Primary hypertension Assessment & Plan: Chronic.  Controlled on hydrochlorothiazide 12.5 mg daily.    Orders: -     hydroCHLOROthiazide; 1 capsule in the morning Orally Once a day for 30 day(s)  Dispense: 90 capsule; Refill: 1  Class 1 obesity due to excess calories without serious comorbidity with body mass index (BMI) of 32.0 to 32.9 in adult  Migraine without aura and without status migrainosus, not intractable Assessment & Plan: Chronic.  Stable.  Not needing meds currently   NAFLD (nonalcoholic fatty liver disease) Assessment & Plan: Chronic.  Has been losing wt on zepbound.  Will check labs  Orders: -     Comprehensive metabolic panel -     Lipid panel -     Hemoglobin A1c  Other specified hypothyroidism Assessment & Plan: Chronic.  Controlled.  Continue synthroid 0.1mg  daily  Orders: -     TSH -      Levothyroxine Sodium; Take 1 tablet (100 mcg total) by mouth daily before breakfast.  Dispense: 90 tablet; Refill: 1  Adjustment disorder with depressed mood -     buPROPion HCl ER (XL); Take 1 tablet (150 mg total) by mouth daily.  Dispense: 30 tablet; Refill: 1  Class 1 obesity due to excess calories with serious comorbidity and body mass index (BMI) of 30.0 to 30.9 in adult -     Zepbound; Inject 10 mg  into the skin once a week.  Dispense: 6 mL; Refill: 1  Left upper quadrant abdominal pain -     CBC with Differential/Platelet -     Lipase -     Amylase -     POCT Urinalysis Dipstick (Automated) -     Urine Culture  Other orders -     Pantoprazole Sodium; Take 1 tablet (40 mg total) by mouth daily.  Dispense: 30 tablet; Refill: 1  Obesity-has lost 20# on zepbound  continue 10mg  weekly Abd pain-new  ?GERD,UTI,doubt pancreatitis, kidney stone, other.  Pantoprazole 40mg  daily  check labs, check u cx.  Worse, er.   Adjustment disorder-depressed.  Will start wellbutrin xl 150mg  daily-SED  f/u 1 mo  Return in about 4 weeks (around 01/11/2023) for mood.   I, Isabelle Course, acting as a scribe for Angelena Sole, MD., have documented all relevant documentation on the behalf of Angelena Sole, MD, as directed by  Angelena Sole, MD while in the presence of Angelena Sole, MD.  I, Angelena Sole, MD, have reviewed all documentation for this visit. The documentation on 12/14/22 for the exam, diagnosis, procedures, and orders are all accurate and complete.  Angelena Sole, MD

## 2022-12-14 NOTE — Assessment & Plan Note (Signed)
Chronic.  Has been losing wt on zepbound.  Will check labs

## 2022-12-14 NOTE — Patient Instructions (Signed)
It was very nice to see you today!  Wellbutrin sent   PLEASE NOTE:  If you had any lab tests please let us know if you have not heard back within a few days. You may see your results on MyChart before we have a chance to review them but we will give you a call once they are reviewed by [place]. If we ordered any referrals today, please let us know if you have not heard from their office within the next week.   Please try these tips to maintain a healthy lifestyle:  Eat most of your calories during the day when you are active. Eliminate processed foods including packaged sweets (pies, cakes, cookies), reduce intake of potatoes, white bread, white pasta, and white rice. Look for whole grain options, oat flour or almond flour.  Each meal should contain half fruits/vegetables, one quarter protein, and one quarter carbs (no bigger than a computer mouse).  Cut down on sweet beverages. This includes juice, soda, and sweet tea. Also watch fruit intake, though this is a healthier sweet option, it still contains natural sugar! Limit to 3 servings daily.  Drink at least 1 glass of water with each meal and aim for at least 8 glasses per day  Exercise at least 150 minutes every week.

## 2022-12-14 NOTE — Assessment & Plan Note (Signed)
Chronic.  Stable.  Not needing meds currently

## 2022-12-15 ENCOUNTER — Other Ambulatory Visit: Payer: Self-pay | Admitting: *Deleted

## 2022-12-15 DIAGNOSIS — R319 Hematuria, unspecified: Secondary | ICD-10-CM

## 2022-12-15 DIAGNOSIS — R1012 Left upper quadrant pain: Secondary | ICD-10-CM

## 2022-12-15 LAB — URINE CULTURE
MICRO NUMBER:: 15700208
Result:: NO GROWTH
SPECIMEN QUALITY:: ADEQUATE

## 2022-12-17 ENCOUNTER — Ambulatory Visit (HOSPITAL_COMMUNITY): Payer: BC Managed Care – PPO

## 2022-12-20 ENCOUNTER — Inpatient Hospital Stay
Admission: RE | Admit: 2022-12-20 | Discharge: 2022-12-20 | Discharge status: Home / Self Care | Payer: BC Managed Care – PPO | Source: Ambulatory Visit | Attending: Family Medicine | Admitting: Family Medicine

## 2022-12-20 DIAGNOSIS — R319 Hematuria, unspecified: Secondary | ICD-10-CM | POA: Diagnosis not present

## 2022-12-20 DIAGNOSIS — R1012 Left upper quadrant pain: Secondary | ICD-10-CM | POA: Diagnosis not present

## 2022-12-21 NOTE — Progress Notes (Signed)
No kidney stone!  The fatty liver has resolved!.   When you come in December, we need to repeat a urine.  How is pain?

## 2023-01-11 ENCOUNTER — Ambulatory Visit: Payer: BC Managed Care – PPO | Admitting: Family Medicine

## 2023-01-11 ENCOUNTER — Encounter: Payer: Self-pay | Admitting: Family Medicine

## 2023-01-11 VITALS — BP 128/78 | HR 75 | Temp 97.9°F | Resp 16 | Ht 61.0 in | Wt 146.0 lb

## 2023-01-11 DIAGNOSIS — M79605 Pain in left leg: Secondary | ICD-10-CM

## 2023-01-11 DIAGNOSIS — M79604 Pain in right leg: Secondary | ICD-10-CM

## 2023-01-11 DIAGNOSIS — F4321 Adjustment disorder with depressed mood: Secondary | ICD-10-CM

## 2023-01-11 MED ORDER — BUPROPION HCL ER (XL) 150 MG PO TB24: 150.0000 mg | ORAL_TABLET | Freq: Every day | ORAL | 1 refills | Status: DC

## 2023-01-11 NOTE — Patient Instructions (Signed)

## 2023-01-11 NOTE — Progress Notes (Signed)
   Subjective:     Patient ID: Maria Vasquez, female    DOB: 09-24-1980, 42 y.o.   MRN: [id number]  Chief Complaint  Patient presents with   Follow-up    4 week on BP    HPI Adjustment disorder w/depressed mood-on wellbutrin 150mg .  No SI.  Doing well. Not sure she wants to do long term  Occ pain in legs-inner thighs just above knees-has some veins.  Mom has as well.   Health Maintenance Due  Topic Date Due   Cervical Cancer Screening (HPV/Pap Cotest)  Never done    Past Medical History:  Diagnosis Date   Thyroid disease     Past Surgical History:  Procedure Laterality Date   COSMETIC SURGERY  2022   tummy tuck     Current Outpatient Medications:    hydrochlorothiazide (MICROZIDE) 12.5 MG capsule, 1 capsule in the morning Orally Once a day for 30 day(s), Disp: 90 capsule, Rfl: 1   levothyroxine (SYNTHROID) 100 MCG tablet, Take 1 tablet (100 mcg total) by mouth daily before breakfast., Disp: 90 tablet, Rfl: 1   ondansetron (ZOFRAN) 4 MG tablet, Take 1 tablet (4 mg total) by mouth every 8 (eight) hours as needed for nausea or vomiting., Disp: 20 tablet, Rfl: 3   pantoprazole (PROTONIX) 40 MG tablet, Take 1 tablet (40 mg total) by mouth daily., Disp: 30 tablet, Rfl: 1   tirzepatide (ZEPBOUND) 10 MG/0.5ML Pen, Inject 10 mg into the skin once a week., Disp: 6 mL, Rfl: 1   UBRELVY 50 MG TABS, Take by mouth., Disp: , Rfl:    Vitamin D, Ergocalciferol, 50000 units CAPS, d2, Disp: , Rfl:    buPROPion (WELLBUTRIN XL) 150 MG 24 hr tablet, Take 1 tablet (150 mg total) by mouth daily., Disp: 90 tablet, Rfl: 1  Allergies  Allergen Reactions   Lorcaserin Other (See Comments)   ROS neg/noncontributory except as noted HPI/below      Objective:     BP 128/78   Pulse 75   Temp 97.9 F (36.6 C) (Oral)   Resp 16   Ht 5\' 1"  (1.549 m)   Wt 146 lb (66.2 kg)   LMP 01/01/2023 (Exact Date)   SpO2 97%   BMI 27.59 kg/m  Wt Readings from Last 3 Encounters:  01/11/23 146 lb (66.2  kg)  12/14/22 148 lb 4 oz (67.2 kg)  09/14/22 153 lb 6 oz (69.6 kg)    Physical Exam   Gen: WDWN NAD HEENT: NCAT, conjunctiva not injected, sclera nonicteric EXT:  no edema MSK: no gross abnormalities.  NEURO: A&O x3.  CN II-XII intact.  PSYCH: normal mood. Good eye contact  Spider veins lower, inner thighs.  No TTP     Assessment & Plan:  Pain in both lower extremities  Adjustment disorder with depressed mood -     buPROPion HCl ER (XL); Take 1 tablet (150 mg total) by mouth daily.  Dispense: 90 tablet; Refill: 1   Adjustment disorder-newer dx.  Improve on wellbutrin xl 150mg  daily.  Continue.  Discussed not stopping too soon as can rebound Pain legs-more in areas of veins.  She will trial compression stockings and if not help, refer vein for consideration of therapy  Return in about 5 months (around 06/11/2023) for chronic follow-up.  Angelena Sole, MD

## 2023-02-11 NOTE — Progress Notes (Deleted)
 PATIENT: Maria Vasquez DOB: 19-Feb-1980  REASON FOR VISIT: follow up HISTORY FROM: patient PRIMARY NEUROLOGIST:   HISTORY OF PRESENT ILLNESS: Today 02/11/23  Maria Vasquez is a 43 y.o. female who has been followed in this office for ***. Returns today for follow-up.   HISTORY Maria Vasquez is a 43 y.o. female here as requested by Roselyn Carlin NOVAK, MD for migraines. has Other specified hypothyroidism and Migraine without aura and without status migrainosus, not intractable on their problem list.  Husband is here and provides much information. Started having headaches for 1-2 years.Mother has migraines. Initially mild. Always right, pulsating/pounding/throbbing, light/sound sensitivity, nausea, vomiting. Tylenol  did not help. She was revently given sumatriptan . And ubrelvy  approved. For last 3 months, 6 migraine days a month, < 10 total headache days a month. No focal deficits with the migraines, no aura, they can last 3-4 days. She was given Ubrelvy . She takes the Ubrelvy  right away and the migraine stops. She is here with her husband who provides much information. A year ago her whole body started contrcting, she went to the ED and she was paralyzed, witnessed, arms contracted, did not lose urine or bowels. No other focal neurologic deficits, associated symptoms, inciting events or modifiable factors.   Reviewed notes, labs and imaging from outside physicians, which showed   Tried > 2 months: imitrex (side effects), zofran , tylenol , ubrelvy  helps tremendously.   REVIEW OF SYSTEMS: Out of a complete 14 system review of symptoms, the patient complains only of the following symptoms, and all other reviewed systems are negative.  ALLERGIES: Allergies  Allergen Reactions   Lorcaserin Other (See Comments)    HOME MEDICATIONS: Outpatient Medications Prior to Visit  Medication Sig Dispense Refill   buPROPion  (WELLBUTRIN  XL) 150 MG 24 hr tablet Take 1 tablet (150 mg total) by mouth daily.  90 tablet 1   hydrochlorothiazide  (MICROZIDE ) 12.5 MG capsule 1 capsule in the morning Orally Once a day for 30 day(s) 90 capsule 1   levothyroxine  (SYNTHROID ) 100 MCG tablet Take 1 tablet (100 mcg total) by mouth daily before breakfast. 90 tablet 1   ondansetron  (ZOFRAN ) 4 MG tablet Take 1 tablet (4 mg total) by mouth every 8 (eight) hours as needed for nausea or vomiting. 20 tablet 3   pantoprazole  (PROTONIX ) 40 MG tablet Take 1 tablet (40 mg total) by mouth daily. 30 tablet 1   tirzepatide  (ZEPBOUND ) 10 MG/0.5ML Pen Inject 10 mg into the skin once a week. 6 mL 1   UBRELVY  50 MG TABS Take by mouth.     Vitamin D, Ergocalciferol, 50000 units CAPS d2     No facility-administered medications prior to visit.    PAST MEDICAL HISTORY: Past Medical History:  Diagnosis Date   Thyroid  disease     PAST SURGICAL HISTORY: Past Surgical History:  Procedure Laterality Date   COSMETIC SURGERY  2022   tummy tuck    FAMILY HISTORY: Family History  Problem Relation Age of Onset   Diabetes Mother    Alcohol abuse Mother    Migraines Mother    Diabetes Father    Alcohol abuse Father     SOCIAL HISTORY: Social History   Socioeconomic History   Marital status: Married    Spouse name: Not on file   Number of children: 2   Years of education: Not on file   Highest education level: Some college, no degree  Occupational History   Occupation: CNA    Comment: [employer]  Tobacco Use  Smoking status: Never   Smokeless tobacco: Never  Vaping Use   Vaping status: Never Used  Substance and Sexual Activity   Alcohol use: Yes    Comment: occ   Drug use: Never   Sexual activity: Yes    Birth control/protection: Surgical    Comment: vasectomy husb  Other Topics Concern   Not on file  Social History Narrative   Not on file   Social Drivers of Health   Financial Resource Strain: Low Risk  (01/10/2023)   Overall Financial Resource Strain (CARDIA)    Difficulty of Paying Living Expenses:  Not hard at all  Food Insecurity: No Food Insecurity (01/10/2023)   Hunger Vital Sign    Worried About Running Out of Food in the Last Year: Never true    Ran Out of Food in the Last Year: Never true  Transportation Needs: No Transportation Needs (01/10/2023)   PRAPARE - Administrator, Civil Service (Medical): No    Lack of Transportation (Non-Medical): No  Physical Activity: Insufficiently Active (01/10/2023)   Exercise Vital Sign    Days of Exercise per Week: 4 days    Minutes of Exercise per Session: 30 min  Stress: No Stress Concern Present (01/10/2023)   [employer] of Occupational Health - Occupational Stress Questionnaire    Feeling of Stress : Only a little  Social Connections: Moderately Isolated (01/10/2023)   Social Connection and Isolation Panel [NHANES]    Frequency of Communication with Friends and Family: More than three times a week    Frequency of Social Gatherings with Friends and Family: More than three times a week    Attends Religious Services: Never    Database Administrator or Organizations: No    Attends Engineer, Structural: Not on file    Marital Status: Married  Intimate Partner Violence: Unknown (06/13/2022)   Received from [employer], [facility]   HITS    Physically Hurt: Not on file    Insult or Talk Down To: Not on file    Threaten Physical Harm: Not on file    Scream or Curse: Not on file      PHYSICAL EXAM  There were no vitals filed for this visit. There is no height or weight on file to calculate BMI.  Generalized: Well developed, in no acute distress   Neurological examination  Mentation: Alert oriented to time, place, history taking. Follows all commands speech and language fluent Cranial nerve II-XII: Pupils were equal round reactive to light. Extraocular movements were full, visual field were full on confrontational test. Facial sensation and strength were normal. Uvula tongue midline. Head turning and  shoulder shrug  were normal and symmetric. Motor: The motor testing reveals 5 over 5 strength of all 4 extremities. Good symmetric motor tone is noted throughout.  Sensory: Sensory testing is intact to soft touch on all 4 extremities. No evidence of extinction is noted.  Coordination: Cerebellar testing reveals good finger-nose-finger and heel-to-shin bilaterally.  Gait and station: Gait is normal. Tandem gait is normal. Romberg is negative. No drift is seen.  Reflexes: Deep tendon reflexes are symmetric and normal bilaterally.   DIAGNOSTIC DATA (LABS, IMAGING, TESTING) - I reviewed patient records, labs, notes, testing and imaging myself where available.  Lab Results  Component Value Date   WBC 7.1 12/14/2022   HGB 13.5 12/14/2022   HCT 39.9 12/14/2022   MCV 92.7 12/14/2022   PLT 402.0 (H) 12/14/2022  Component Value Date/Time   NA 141 12/14/2022 0925   K 3.7 12/14/2022 0925   CL 105 12/14/2022 0925   CO2 28 12/14/2022 0925   GLUCOSE 83 12/14/2022 0925   BUN 22 12/14/2022 0925   CREATININE 0.68 12/14/2022 0925   CALCIUM 9.6 12/14/2022 0925   PROT 7.0 12/14/2022 0925   ALBUMIN 4.3 12/14/2022 0925   AST 21 12/14/2022 0925   ALT 37 (H) 12/14/2022 0925   ALKPHOS 55 12/14/2022 0925   BILITOT 0.5 12/14/2022 0925   GFRNONAA >60 06/27/2021 1237   Lab Results  Component Value Date   CHOL 182 12/14/2022   HDL 60.30 12/14/2022   LDLCALC 94 12/14/2022   TRIG 140.0 12/14/2022   CHOLHDL 3 12/14/2022   Lab Results  Component Value Date   HGBA1C 5.1 12/14/2022   No results found for: VITAMINB12 Lab Results  Component Value Date   TSH 4.41 12/14/2022      ASSESSMENT AND PLAN 43 y.o. year old female  has a past medical history of Thyroid  disease. here with ***     Duwaine Russell, MSN, NP-C 02/11/2023, 2:28 PM [facility] [address] [place], [place] [address] [phone]

## 2023-02-12 ENCOUNTER — Ambulatory Visit: Payer: BC Managed Care – PPO | Admitting: Adult Health

## 2023-02-15 ENCOUNTER — Emergency Department (HOSPITAL_BASED_OUTPATIENT_CLINIC_OR_DEPARTMENT_OTHER): Payer: BC Managed Care – PPO

## 2023-02-15 ENCOUNTER — Emergency Department (HOSPITAL_BASED_OUTPATIENT_CLINIC_OR_DEPARTMENT_OTHER)
Admission: EM | Admit: 2023-02-15 | Discharge: 2023-02-15 | Disposition: A | Discharge status: Home / Self Care | Payer: BC Managed Care – PPO | Attending: Emergency Medicine | Admitting: Emergency Medicine

## 2023-02-15 ENCOUNTER — Other Ambulatory Visit: Payer: Self-pay

## 2023-02-15 ENCOUNTER — Ambulatory Visit: Payer: Self-pay | Admitting: Family Medicine

## 2023-02-15 ENCOUNTER — Encounter (HOSPITAL_BASED_OUTPATIENT_CLINIC_OR_DEPARTMENT_OTHER): Payer: Self-pay | Admitting: Emergency Medicine

## 2023-02-15 DIAGNOSIS — I10 Essential (primary) hypertension: Secondary | ICD-10-CM | POA: Insufficient documentation

## 2023-02-15 DIAGNOSIS — R932 Abnormal findings on diagnostic imaging of liver and biliary tract: Secondary | ICD-10-CM | POA: Diagnosis not present

## 2023-02-15 DIAGNOSIS — Z79899 Other long term (current) drug therapy: Secondary | ICD-10-CM | POA: Diagnosis not present

## 2023-02-15 DIAGNOSIS — R1011 Right upper quadrant pain: Secondary | ICD-10-CM | POA: Diagnosis not present

## 2023-02-15 DIAGNOSIS — R10821 Right upper quadrant rebound abdominal tenderness: Secondary | ICD-10-CM | POA: Diagnosis not present

## 2023-02-15 DIAGNOSIS — K5730 Diverticulosis of large intestine without perforation or abscess without bleeding: Secondary | ICD-10-CM | POA: Diagnosis not present

## 2023-02-15 DIAGNOSIS — R140 Abdominal distension (gaseous): Secondary | ICD-10-CM | POA: Diagnosis not present

## 2023-02-15 DIAGNOSIS — R109 Unspecified abdominal pain: Secondary | ICD-10-CM

## 2023-02-15 HISTORY — DX: Essential (primary) hypertension: I10

## 2023-02-15 LAB — COMPREHENSIVE METABOLIC PANEL
ALT: 30 U/L (ref 0–44)
AST: 18 U/L (ref 15–41)
Albumin: 4.7 g/dL (ref 3.5–5.0)
Alkaline Phosphatase: 62 U/L (ref 38–126)
Anion gap: 9 (ref 5–15)
BUN: 24 mg/dL — ABNORMAL HIGH (ref 6–20)
CO2: 27 mmol/L (ref 22–32)
Calcium: 9.7 mg/dL (ref 8.9–10.3)
Chloride: 104 mmol/L (ref 98–111)
Creatinine, Ser: 0.76 mg/dL (ref 0.44–1.00)
GFR, Estimated: >60 mL/min (ref >60)
Glucose, Bld: 122 mg/dL — ABNORMAL HIGH (ref 70–99)
Potassium: 3.1 mmol/L — ABNORMAL LOW (ref 3.5–5.1)
Sodium: 140 mmol/L (ref 135–145)
Total Bilirubin: 0.7 mg/dL (ref 0.0–1.2)
Total Protein: 8 g/dL (ref 6.5–8.1)

## 2023-02-15 LAB — URINALYSIS, ROUTINE W REFLEX MICROSCOPIC
Bilirubin Urine: NEGATIVE
Glucose, UA: NEGATIVE mg/dL
Ketones, ur: NEGATIVE mg/dL
Leukocytes,Ua: NEGATIVE
Nitrite: NEGATIVE
Specific Gravity, Urine: 1.031 — ABNORMAL HIGH (ref 1.005–1.030)
pH: 5.5 (ref 5.0–8.0)

## 2023-02-15 LAB — CBC
HCT: 40.3 % (ref 36.0–46.0)
Hemoglobin: 14 g/dL (ref 12.0–15.0)
MCH: 30.6 pg (ref 26.0–34.0)
MCHC: 34.7 g/dL (ref 30.0–36.0)
MCV: 88.2 fL (ref 80.0–100.0)
Platelets: 406 10*3/uL — ABNORMAL HIGH (ref 150–400)
RBC: 4.57 MIL/uL (ref 3.87–5.11)
RDW: 13.2 % (ref 11.5–15.5)
WBC: 6.4 10*3/uL (ref 4.0–10.5)
nRBC: 0 % (ref 0.0–0.2)

## 2023-02-15 LAB — HCG, QUANTITATIVE, PREGNANCY: hCG, Beta Chain, Quant, S: 4 m[IU]/mL (ref <5)

## 2023-02-15 LAB — LIPASE, BLOOD: Lipase: 38 U/L (ref 11–51)

## 2023-02-15 MED ORDER — POTASSIUM CHLORIDE CRYS ER 20 MEQ PO TBCR
40.0000 meq | EXTENDED_RELEASE_TABLET | Freq: Once | ORAL | Status: AC
  Administered 2023-02-15: 40 meq via ORAL
  Filled 2023-02-15: qty 2

## 2023-02-15 MED ORDER — IOHEXOL 300 MG/ML  SOLN
100.0000 mL | Freq: Once | INTRAMUSCULAR | Status: AC | PRN
  Administered 2023-02-15: 85 mL via INTRAVENOUS

## 2023-02-15 NOTE — ED Triage Notes (Signed)
 3 days ago sudden onset right flank pain,worse when lying down. The pain remained, not as strong for 2 days and then last night when she was turning, she felt more pain under her right ribs and around to her back, couldn't breathe pain was so bad. Pt still has the pain, worse when moving.

## 2023-02-15 NOTE — Telephone Encounter (Signed)
 Copied from CRM [id number]. Topic: Clinical - Red Word Triage >> Feb 15, 2023  8:15 AM Joanell NOVAK wrote: Red Word that prompted transfer to Nurse Triage: Pt stated that she has been in severe pain for 3 days on the right side of her stomach   Chief Complaint: severe abdominal pain Symptoms: 10/10 worsening constant abdominal pain to right side under ribs and into back, prior chills, prior shaking Frequency: continual Pertinent Negatives: Patient denies chest pain, SOB, pain to RLQ, vomiting, diarrhea, sweating, nausea, urination pain, fever Disposition: [x] ED /[] Urgent Care (no appt availability in office) / [] Appointment(In office/virtual)/ []  [facility]/ [] Home Care/ [] Refused Recommended Disposition /[] [place] [facility]/ []  Follow-up with PCP Additional Notes: Pt reporting severe abdominal pain to right side under ribs with the pain going into back for 3 days, been worsening, pt was very cold and shaking because of the pain last night. Pt denies chest pain, SOB, pain to RLQ, nausea, vomiting, diarrhea, sweating, fever, pain elsewhere. Advised pt be examined in ED straight away with another adult driving her there. Pt verbalized understanding and will head there shortly.  Reason for Disposition  [1] SEVERE pain (e.g., excruciating) AND [2] present > 1 hour  Answer Assessment - Initial Assessment Questions 1. LOCATION: Where does it hurt?      Middle right side under ribs 2. RADIATION: Does the pain shoot anywhere else? (e.g., chest, back)     Into back 3. ONSET: When did the pain begin? (e.g., minutes, hours or days ago)      3 days 4. SUDDEN: Gradual or sudden onset?     sudden 5. PATTERN Does the pain come and go, or is it constant?    - If it comes and goes: How long does it last? Do you have pain now?     (Note: Comes and goes means the pain is intermittent. It goes away completely between bouts.)    - If constant: Is it getting better,  staying the same, or getting worse?      (Note: Constant means the pain never goes away completely; most serious pain is constant and gets worse.)      Right there all the time 6. SEVERITY: How bad is the pain?  (e.g., Scale 1-10; mild, moderate, or severe)    - MILD (1-3): Doesn't interfere with normal activities, abdomen soft and not tender to touch..     - MODERATE (4-7): Interferes with normal activities or awakens from sleep, abdomen tender to touch.     - SEVERE (8-10): Excruciating pain, doubled over, unable to do any normal activities.       Shaking from the pain 10/10 7. RECURRENT SYMPTOM: Have you ever had this type of stomach pain before? If Yes, ask: When was the last time? and What happened that time?      denies 8. AGGRAVATING FACTORS: Does anything seem to cause this pain? (e.g., foods, stress, alcohol)     Standing or laying on right side 9. CARDIAC SYMPTOMS: Do you have any of the following symptoms: chest pain, difficulty breathing, sweating, nausea?     denies 10. OTHER SYMPTOMS: Do you have any other symptoms? (e.g., back pain, diarrhea, fever, urination pain, vomiting)       Was very cold and shaking because pain was so strong 11. PREGNANCY: Is there any chance you are pregnant? When was your last menstrual period?       denies  Protocols used: Abdominal Pain - Upper-A-AH

## 2023-02-15 NOTE — ED Provider Notes (Signed)
 [place] [facility]MENT AT [facility] Provider Note   CSN: [id number] Arrival date & time: 02/15/23  1739     History  No chief complaint on file.   Maria Vasquez is a 43 y.o. female with history of thyroid  disease, nonalcoholic fatty liver disease, migraines, hypertension who presents the emergency department complaining of right sided flank pain.  Symptoms have been going on for about 3 days or so, initially were worse with laying down.  Pain had somewhat subsided for the past 2 days, but last night it returned when she was turning.  It felt more like pain under her ribs and radiating to her back.  She had some difficulty breathing due to pain.  She was able to take Tylenol  with significant improvement.  Pain today is worse with moving.  History of tummy tuck surgery, still has her gallbladder and appendix.  Symptoms do not change at all with eating, no vomiting or diarrhea.  No urinary symptoms, no vaginal bleeding or discharge.  HPI     Home Medications Prior to Admission medications   Medication Sig Start Date End Date Taking? Authorizing Provider  buPROPion  (WELLBUTRIN  XL) 150 MG 24 hr tablet Take 1 tablet (150 mg total) by mouth daily. 01/11/23   Wendolyn Jenkins Jansky, MD  hydrochlorothiazide  (MICROZIDE ) 12.5 MG capsule 1 capsule in the morning Orally Once a day for 30 day(s) 12/14/22   Wendolyn Jenkins Jansky, MD  levothyroxine  (SYNTHROID ) 100 MCG tablet Take 1 tablet (100 mcg total) by mouth daily before breakfast. 12/14/22   Wendolyn Jenkins Jansky, MD  ondansetron  (ZOFRAN ) 4 MG tablet Take 1 tablet (4 mg total) by mouth every 8 (eight) hours as needed for nausea or vomiting. 05/03/22   Wendolyn Jenkins Jansky, MD  pantoprazole  (PROTONIX ) 40 MG tablet Take 1 tablet (40 mg total) by mouth daily. 12/14/22   Wendolyn Jenkins Jansky, MD  tirzepatide  (ZEPBOUND ) 10 MG/0.5ML Pen Inject 10 mg into the skin once a week. 12/14/22   Wendolyn Jenkins Jansky, MD  UBRELVY  50 MG TABS Take by mouth. 08/22/22   [provider]  Vitamin D, Ergocalciferol, 50000 units CAPS d2 11/07/21   [provider]      Allergies    Patient has no active allergies.    Review of Systems   Review of Systems  Gastrointestinal:  Positive for abdominal pain.  Genitourinary:  Positive for flank pain.  All other systems reviewed and are negative.   Physical Exam Updated Vital Signs BP 122/89   Pulse 66   Temp 98 F (36.7 C)   Resp 18   Wt 63.5 kg   SpO2 99%   BMI 26.45 kg/m  Physical Exam Vitals and nursing note reviewed.  Constitutional:      Appearance: Normal appearance.  HENT:     Head: Normocephalic and atraumatic.  Eyes:     Conjunctiva/sclera: Conjunctivae normal.  Cardiovascular:     Rate and Rhythm: Normal rate and regular rhythm.  Pulmonary:     Effort: Pulmonary effort is normal. No respiratory distress.     Breath sounds: Normal breath sounds.  Abdominal:     General: There is no distension.     Palpations: Abdomen is soft.     Tenderness: There is abdominal tenderness in the right upper quadrant. There is no right CVA tenderness, left CVA tenderness, guarding or rebound.  Skin:    General: Skin is warm and dry.  Neurological:     General: No focal deficit present.  Mental Status: She is alert.     ED Results / Procedures / Treatments   Labs (all labs ordered are listed, but only abnormal results are displayed) Labs Reviewed  COMPREHENSIVE METABOLIC PANEL - Abnormal; Notable for the following components:      Result Value   Potassium 3.1 (*)    Glucose, Bld 122 (*)    BUN 24 (*)    All other components within normal limits  CBC - Abnormal; Notable for the following components:   Platelets 406 (*)    All other components within normal limits  URINALYSIS, ROUTINE W REFLEX MICROSCOPIC - Abnormal; Notable for the following components:   Specific Gravity, Urine 1.031 (*)    Hgb urine dipstick SMALL (*)    Protein, ur TRACE (*)    Bacteria, UA RARE (*)    All  other components within normal limits  LIPASE, BLOOD  HCG, QUANTITATIVE, PREGNANCY    EKG None  Radiology CT ABDOMEN PELVIS W CONTRAST Result Date: 02/15/2023 CLINICAL DATA:  Right upper quadrant and right flank pain EXAM: CT ABDOMEN AND PELVIS WITH CONTRAST TECHNIQUE: Multidetector CT imaging of the abdomen and pelvis was performed using the standard protocol following bolus administration of intravenous contrast. RADIATION DOSE REDUCTION: This exam was performed according to the departmental dose-optimization program which includes automated exposure control, adjustment of the mA and/or kV according to patient size and/or use of iterative reconstruction technique. CONTRAST:  85mL OMNIPAQUE  IOHEXOL  300 MG/ML  SOLN COMPARISON:  12/20/2022 FINDINGS: Lower chest: No pleural or pericardial effusion. Visualized lung bases clear. Hepatobiliary: Stable 1 cm probable cyst in hepatic patent segment 5/6. No new liver lesion or biliary ductal dilatation. Gallbladder nondistended. No calcified gallstones. Pancreas: Unremarkable. No pancreatic ductal dilatation or surrounding inflammatory changes. Spleen: Normal in size without focal abnormality. Adrenals/Urinary Tract: Adrenal glands are unremarkable. Kidneys are normal, without renal calculi, focal lesion, or hydronephrosis. Bladder is unremarkable. Stomach/Bowel: Stomach is distended by ingested material, without acute finding. Small bowel nondistended. Normal appendix. The colon is partially distended, with multiple sigmoid diverticula; no adjacent inflammatory change. Vascular/Lymphatic: . no aortic aneurysm. Portal vein patent. Dilated refluxing left ovarian vein with engorged left parametrial venous plexus. No abdominal or pelvic adenopathy. Reproductive: Dilated left parametrial venous plexus supplied by refluxing dilated left ovarian vein. Uterus unremarkable. No adnexal mass. Other: No ascites.  No free air. Musculoskeletal: Mild degenerative disc disease  L5-S1. IMPRESSION: 1. No acute findings. 2. Sigmoid diverticulosis. 3. Dilated refluxing left ovarian vein with engorged left parametrial venous plexus. Correlate for pelvic congestion syndrome. Electronically Signed   By: JONETTA Faes M.D.   On: 02/15/2023 19:43    Procedures Procedures    Medications Ordered in ED Medications  iohexol  (OMNIPAQUE ) 300 MG/ML solution 100 mL (85 mLs Intravenous Contrast Given 02/15/23 1930)  potassium chloride  SA (KLOR-CON  M) CR tablet 40 mEq (40 mEq Oral Given 02/15/23 2017)    ED Course/ Medical Decision Making/ A&P                                 Medical Decision Making This patient is a 43 y.o. female  who presents to the ED for concern of R sided flank pain x 3 days.   Differential diagnoses prior to evaluation: The emergent differential diagnosis includes, but is not limited to,  AAA, renal artery/vein embolism/thrombosis, mesenteric ischemia, pyelonephritis, nephrolithiasis, cystitis, biliary colic, pancreatitis, perforated peptic ulcer, appendicitis, diverticulitis, bowel obstruction,  Ectopic Pregnancy, PID/TOA, Ovarian cyst, Ovarian torsion. This is not an exhaustive differential.   Past Medical History / Co-morbidities / Social History: thyroid  disease, nonalcoholic fatty liver disease, migraines, hypertension  Additional history: Chart reviewed. Pertinent results include: hx tummy tuck procedure. No other abdominal surgeries. Reviewed UC visit note present at bedside.   Physical Exam: Physical exam performed. The pertinent findings include: Normal vital signs, no acute distress.  Abdomen soft, with right upper quadrant tenderness to palpation, no guarding or rebound.  Lab Tests/Imaging studies: I personally interpreted labs/imaging and the pertinent results include: No leukocytosis, normal hemoglobin.  Potassium 3.1, otherwise CMP unremarkable.  Normal lipase.  CT abdomen pelvis with no acute findings, possible pelvic congestion syndrome  related to reflux and left ovarian vein. I agree with the radiologist interpretation.  Disposition: After consideration of the diagnostic results and the patients response to treatment, I feel that emergency department workup does not suggest an emergent condition requiring admission or immediate intervention beyond what has been performed at this time. The plan is: Discharge to home.  Explained to the patient we did not find any emergent etiology for her symptoms today.  The radiologist recommended correlating the CT findings of possible pelvic congestion syndrome clinically, and this patient has no lower abdominal tenderness to palpation, really any pelvic pain, I suspect this is not contributing to her symptoms today.  She does not currently have an OB/GYN, so I have given her referral for 1.  She was having improvement with over-the-counter medications at home, and will recommend she continue this.  At this time I suspect her pain could be related to musculoskeletal pain/injury.  The patient is safe for discharge and has been instructed to return immediately for worsening symptoms, change in symptoms or any other concerns.  Final Clinical Impression(s) / ED Diagnoses Final diagnoses:  Right sided abdominal pain  Right flank pain    Rx / DC Orders ED Discharge Orders     None      Portions of this report may have been transcribed using voice recognition software. Every effort was made to ensure accuracy; however, inadvertent computerized transcription errors may be present.    Twana Wileman T, PA-C 02/15/23 2025    Mannie Pac T, DO 02/15/23 2254

## 2023-02-15 NOTE — Discharge Instructions (Signed)
 You were seen in the emergency department today for right sided abdominal and flank pain.  As we discussed your potassium level was a little low, so we gave you some replacement.  Otherwise your blood work looked very reassuring.  Your CT scan showed findings of possible pelvic congestion syndrome, especially related to your left ovarian vein.  I recommend establishing with an OB/GYN to discuss follow-up about this.  I have attached contact information for one of the offices that we work with upstairs.  I would recommend continuing Tylenol  or other over-the-counter medications as you have been doing, and keeping a close eye on your symptoms.  Return to the ER for any new or worsening symptoms.

## 2023-03-13 ENCOUNTER — Telehealth: Payer: Self-pay

## 2023-03-13 NOTE — Telephone Encounter (Signed)
We received notification that patient's prior auth for Zepbound 10mg /0.5 ml auto injectors is expiring on 03/16/23. I have submitted all chart notes that insurance company required to their fax number [phone].

## 2023-03-22 ENCOUNTER — Other Ambulatory Visit: Payer: Self-pay | Admitting: Family Medicine

## 2023-03-22 MED ORDER — UBRELVY 50 MG PO TABS: 50.0000 mg | ORAL_TABLET | Freq: Every day | ORAL | 1 refills | Status: DC | PRN

## 2023-03-22 NOTE — Telephone Encounter (Signed)
Copied from CRM [id number]. Topic: Clinical - Medication Refill >> Mar 22, 2023 11:18 AM Almira Coaster wrote: Most Recent Primary Care Visit:  Provider: Lutricia Horsfall MARIE  Department: [facility]  Visit Type: OFFICE VISIT  Date: 01/11/2023  Medication: UBRELVY 50 MG TABS  Has the patient contacted their pharmacy? Yes, requires a pre auth and new prescription from primary care (Agent: If no, request that the patient contact the pharmacy for the refill. If patient does not wish to contact the pharmacy document the reason why and proceed with request.) (Agent: If yes, when and what did the pharmacy advise?)  Is this the correct pharmacy for this prescription? Yes If no, delete pharmacy and type the correct one.  This is the patient's preferred pharmacy:  [id number] [place], [place] - [address] [place] [address] Phone: [phone] Fax: [phone]   Has the prescription been filled recently? No  Is the patient out of the medication? Yes  Has the patient been seen for an appointment in the last year OR does the patient have an upcoming appointment? Yes  Can we respond through MyChart? Yes  Agent: Please be advised that Rx refills may take up to 3 business days. We ask that you follow-up with your pharmacy.

## 2023-04-12 DIAGNOSIS — H00025 Hordeolum internum left lower eyelid: Secondary | ICD-10-CM | POA: Diagnosis not present

## 2023-04-15 DIAGNOSIS — A46 Erysipelas: Secondary | ICD-10-CM | POA: Diagnosis not present

## 2023-04-15 DIAGNOSIS — H00012 Hordeolum externum right lower eyelid: Secondary | ICD-10-CM | POA: Diagnosis not present

## 2023-05-01 ENCOUNTER — Encounter: Payer: Self-pay | Admitting: Family Medicine

## 2023-05-01 ENCOUNTER — Ambulatory Visit: Admitting: Family Medicine

## 2023-05-01 VITALS — BP 112/80 | HR 80 | Temp 98.1°F | Resp 16 | Ht 61.0 in | Wt 148.0 lb

## 2023-05-01 DIAGNOSIS — H029 Unspecified disorder of eyelid: Secondary | ICD-10-CM | POA: Diagnosis not present

## 2023-05-01 MED ORDER — CEPHALEXIN 500 MG PO CAPS: 500.0000 mg | ORAL_CAPSULE | Freq: Two times a day (BID) | ORAL | 0 refills | Status: DC

## 2023-05-01 NOTE — Patient Instructions (Signed)
 Soaks  Sent the antibiotics  Referral to ophth.

## 2023-05-01 NOTE — Progress Notes (Signed)
 Subjective:     Patient ID: Maria Vasquez, female    DOB: 06-20-1980, 43 y.o.   MRN: [id number]  Chief Complaint  Patient presents with   Belepharitis    Left lower eyelid swelling that started 3 weeks ago, redness, no pain now, just itching, went to UC    HPI Discussed the use of AI scribe software for clinical note transcription with the patient, who gave verbal consent to proceed.  History of Present Illness Maria Vasquez is a 43 year old female who presents with a persistent bump and irritation on the left lower eyelid.  The bump on the left lower eyelid began approximately three weeks ago as a small, itchy lesion that progressively enlarged, becoming more swollen and painful. The swelling extended to the eyebrow and left cheek, leading her to seek care at urgent care facilities twice. During the first visit, she was prescribed an ointment, which did not alleviate her symptoms. On March 9th, she returned to urgent care due to increased redness on her cheek and was prescribed an 8-day course of Keflex. The swelling and redness over the eyebrow and cheek improved with the antibiotic treatment.  Currently, she experiences itching and discomfort in the area, with occasional swelling under the eyelid. No fever or pain is present now, although the initial pain was significant. There is no drainage from the eyes, and no pus formation in the bump. The bump has not developed a head, which is atypical for a sty, and she is concerned about the persistent nature of the swelling and irritation. She completed the course of Keflex about a week ago.  She attempted warm soaks but discontinued them due to her demanding work schedule, which involves working sixteen-hour shifts with variable lunch breaks. She occasionally manages to perform the soaks during her breaks but finds it challenging to maintain consistency.    Health Maintenance Due  Topic Date Due   Cervical Cancer Screening (HPV/Pap Cotest)   Never done    Past Medical History:  Diagnosis Date   Hypertension    Thyroid disease     Past Surgical History:  Procedure Laterality Date   COSMETIC SURGERY  2022   tummy tuck     Current Outpatient Medications:    buPROPion (WELLBUTRIN XL) 150 MG 24 hr tablet, Take 1 tablet (150 mg total) by mouth daily., Disp: 90 tablet, Rfl: 1   cephALEXin (KEFLEX) 500 MG capsule, Take 1 capsule (500 mg total) by mouth 2 (two) times daily., Disp: 20 capsule, Rfl: 0   hydrochlorothiazide (MICROZIDE) 12.5 MG capsule, 1 capsule in the morning Orally Once a day for 30 day(s), Disp: 90 capsule, Rfl: 1   levothyroxine (SYNTHROID) 100 MCG tablet, Take 1 tablet (100 mcg total) by mouth daily before breakfast., Disp: 90 tablet, Rfl: 1   ondansetron (ZOFRAN) 4 MG tablet, Take 1 tablet (4 mg total) by mouth every 8 (eight) hours as needed for nausea or vomiting., Disp: 20 tablet, Rfl: 3   pantoprazole (PROTONIX) 40 MG tablet, Take 1 tablet (40 mg total) by mouth daily., Disp: 30 tablet, Rfl: 1   tirzepatide (ZEPBOUND) 10 MG/0.5ML Pen, Inject 10 mg into the skin once a week., Disp: 6 mL, Rfl: 1   UBRELVY 50 MG TABS, Take 1 tablet (50 mg total) by mouth daily as needed., Disp: 30 tablet, Rfl: 1   Vitamin D, Ergocalciferol, 50000 units CAPS, d2, Disp: , Rfl:   No Known Allergies ROS neg/noncontributory except as noted HPI/below  Objective:     BP 112/80   Pulse 80   Temp 98.1 F (36.7 C) (Temporal)   Resp 16   Ht 5\' 1"  (1.549 m)   Wt 148 lb (67.1 kg)   LMP 02/11/2023 (Approximate)   SpO2 99%   BMI 27.96 kg/m  Wt Readings from Last 3 Encounters:  05/01/23 148 lb (67.1 kg)  02/15/23 140 lb (63.5 kg)  01/11/23 146 lb (66.2 kg)    Physical Exam   Gen: WDWN NAD HEENT: NCAT, conjunctiva not injected, sclera nonicteric L lower eyelid near medial canthus-bb sized lump.  No d/c.  No redness, but pinkness and some slight swelling L cheek EXT:  no edema MSK: no gross abnormalities.  NEURO:  A&O x3.  CN II-XII intact.  PSYCH: normal mood. Good eye contact     Assessment & Plan:  Lesion of left lower eyelid -     Ambulatory referral to Ophthalmology  Other orders -     Cephalexin; Take 1 capsule (500 mg total) by mouth 2 (two) times daily.  Dispense: 20 capsule; Refill: 0  Assessment and Plan Assessment & Plan Left lower eyelid swelling and irritation   She presents with a three-week history of swelling, irritation, and itching of the left lower eyelid. Initial treatment with ointment and Keflex for eight days led to some improvement, but intermittent swelling and itching persist. The differential diagnosis includes a sty or shingles or other lesion, though the presentation is atypical for both. There is no pus, and only a single bump is present. The condition is not resolving as expected, with ongoing redness and swelling. Styes can take months to resolve. Continue warm soaks as often as possible. Prescribe another course of Keflex. Refer to an ophthalmologist for further evaluation. Advise to report worsening symptoms, such as increased swelling or redness.   Allergies may contribute to swelling and discoloration under both eyes, exacerbated by long working hours. Swelling is more pronounced below the left eye, but discoloration is present under both eyes, likely due to allergies and fatigue. Consider allergy management strategies.    Return if symptoms worsen or fail to improve.  Angelena Sole, MD

## 2023-05-02 ENCOUNTER — Encounter: Payer: Self-pay | Admitting: Family Medicine

## 2023-05-02 ENCOUNTER — Other Ambulatory Visit: Payer: Self-pay | Admitting: *Deleted

## 2023-05-02 MED ORDER — UBRELVY 50 MG PO TABS: 50.0000 mg | ORAL_TABLET | Freq: Every day | ORAL | 1 refills | Status: DC | PRN

## 2023-05-10 ENCOUNTER — Telehealth: Payer: Self-pay | Admitting: *Deleted

## 2023-05-10 NOTE — Telephone Encounter (Signed)
 Copied from CRM [id number]. Topic: Clinical - Prescription Issue >> May 10, 2023 12:37 PM Melissa C wrote: Reason for CRM: [facility] called stating that prior authorization is needed for UBRELVY 50 MG TABS she is faxing information over that is needed for prior authorization to be signed and returned along with patient's most recent office visit note. Thank you.  Noted, will have pcp sigh when received.

## 2023-05-15 ENCOUNTER — Telehealth: Payer: Self-pay

## 2023-05-15 NOTE — Telephone Encounter (Signed)
 FYI

## 2023-05-15 NOTE — Telephone Encounter (Signed)
 Pharmacy Patient Advocate Encounter   Received notification from Onbase that prior authorization for UBRELVY 50MG  is required/requested.   Insurance verification completed.   The patient is insured through  [employer]  .   Per test claim: PA required; However, NEW/RECENT labs/notes are needed to complete & submit PA request. Please see below.

## 2023-05-17 NOTE — Telephone Encounter (Signed)
 Patient husband is checking in regarding tha pa he would like a call back regarding this

## 2023-05-18 NOTE — Telephone Encounter (Signed)
 Left detailed message informing patient

## 2023-05-18 NOTE — Telephone Encounter (Signed)
 Please schedule patient, in office or virtual per Dr. Ruthine Dose.

## 2023-05-18 NOTE — Telephone Encounter (Signed)
 PA form requests office notes from the past three months. Last office visit did not mention migraines or Bernita Raisin so that would not help. Office notes from June are outside of the requested time frame. PA form is in media to be completed and faxed in with recent chart notes.

## 2023-05-22 ENCOUNTER — Ambulatory Visit: Admitting: Family Medicine

## 2023-05-22 ENCOUNTER — Encounter: Payer: Self-pay | Admitting: Family Medicine

## 2023-05-22 ENCOUNTER — Telehealth: Payer: Self-pay

## 2023-05-22 ENCOUNTER — Other Ambulatory Visit: Payer: Self-pay | Admitting: Family Medicine

## 2023-05-22 VITALS — BP 117/84 | HR 90 | Temp 97.9°F | Ht 61.0 in | Wt 149.0 lb

## 2023-05-22 DIAGNOSIS — E6609 Other obesity due to excess calories: Secondary | ICD-10-CM

## 2023-05-22 DIAGNOSIS — E66811 Obesity, class 1: Secondary | ICD-10-CM

## 2023-05-22 DIAGNOSIS — G43009 Migraine without aura, not intractable, without status migrainosus: Secondary | ICD-10-CM | POA: Diagnosis not present

## 2023-05-22 DIAGNOSIS — Z6830 Body mass index (BMI) 30.0-30.9, adult: Secondary | ICD-10-CM | POA: Diagnosis not present

## 2023-05-22 MED ORDER — UBRELVY 50 MG PO TABS: 50.0000 mg | ORAL_TABLET | Freq: Every day | ORAL | 3 refills | Status: DC | PRN

## 2023-05-22 MED ORDER — ZEPBOUND 12.5 MG/0.5ML ~~LOC~~ SOAJ
12.5000 mg | SUBCUTANEOUS | 0 refills | Status: DC
Start: 2023-05-22 — End: 2023-09-11

## 2023-05-22 NOTE — Telephone Encounter (Signed)
 Pharmacy Patient Advocate Encounter   Received notification from Physician's Office that prior authorization for UBRELVY is required/requested.   Insurance verification completed.   The patient is insured through  [employer]  .   Per test claim: PA required; PA submitted to above mentioned insurance via Fax Key/confirmation #/EOC --- Status is pending    FAX: [phone]

## 2023-05-22 NOTE — Progress Notes (Signed)
 Subjective:     Patient ID: Maria Vasquez, female    DOB: 1980/07/13, 43 y.o.   MRN: [id number]  Chief Complaint  Patient presents with   Questions regarding medications    Pt was trying to get a refill on ubrelvy but it looks like it may need a PA     HPI Discussed the use of AI scribe software for clinical note transcription with the patient, who gave verbal consent to proceed.  History of Present Illness Maria Vasquez is a 43 year old female who presents for management of her headache symptoms.  She experiences migraines three to four times a month, often triggered by stress. The headaches are primarily located on the right side and can last three to four days if untreated. Associated symptoms include nausea, chills, and swelling of the eyes during these episodes.  She has previously sought treatment at the emergency room and urgent care for her headaches. She has tried various medications including acetaminophen, ibuprofen, and Excedrin migraine, with varying degrees of effectiveness. Ibuprofen worsened her symptoms, while Excedrin migraine takes a long time to work. She has also tried sumatriptan in the past but does not recall its effects.  Currently, she uses [place], which she finds effective in aborting the headaches if taken at the onset of symptoms. She takes a 50 mg dose, which she reports as effective. She also mentions using acetaminophen 500 mg when necessary, although it is not as effective as Bernita Raisin.  She has seen neuro in past and they were able to get the ubrelvy approved in past.    Her husband frequently contacts the insurance company regarding coverage for her migraine medication, indicating ongoing issues with insurance approval.  In addition to her migraine treatment, she is taking Zipbound 10.5 mg for weight management. She reports a plateau in her weight loss despite maintaining her diet and exercise regimen. She is concerned about the potential side effect of  Zipbound affecting her menstrual cycle, as she has not had a period for two months. She has ruled out pregnancy with a test last month, which was negative.    Health Maintenance Due  Topic Date Due   Cervical Cancer Screening (HPV/Pap Cotest)  Never done    Past Medical History:  Diagnosis Date   Hypertension    Thyroid disease     Past Surgical History:  Procedure Laterality Date   COSMETIC SURGERY  2022   tummy tuck     Current Outpatient Medications:    buPROPion (WELLBUTRIN XL) 150 MG 24 hr tablet, Take 1 tablet (150 mg total) by mouth daily., Disp: 90 tablet, Rfl: 1   hydrochlorothiazide (MICROZIDE) 12.5 MG capsule, 1 capsule in the morning Orally Once a day for 30 day(s), Disp: 90 capsule, Rfl: 1   levothyroxine (SYNTHROID) 100 MCG tablet, Take 1 tablet (100 mcg total) by mouth daily before breakfast., Disp: 90 tablet, Rfl: 1   tirzepatide (ZEPBOUND) 12.5 MG/0.5ML Pen, Inject 12.5 mg into the skin once a week., Disp: 6 mL, Rfl: 0   Vitamin D, Ergocalciferol, 50000 units CAPS, d2, Disp: , Rfl:    cephALEXin (KEFLEX) 500 MG capsule, Take 1 capsule (500 mg total) by mouth 2 (two) times daily. (Patient not taking: Reported on 05/22/2023), Disp: 20 capsule, Rfl: 0   ondansetron (ZOFRAN) 4 MG tablet, Take 1 tablet (4 mg total) by mouth every 8 (eight) hours as needed for nausea or vomiting. (Patient not taking: Reported on 05/22/2023), Disp: 20 tablet, Rfl: 3   pantoprazole (  PROTONIX) 40 MG tablet, Take 1 tablet (40 mg total) by mouth daily. (Patient not taking: Reported on 05/22/2023), Disp: 30 tablet, Rfl: 1   UBRELVY 50 MG TABS, Take 1 tablet (50 mg total) by mouth daily as needed., Disp: 8 tablet, Rfl: 3  No Known Allergies ROS neg/noncontributory except as noted HPI/below      Objective:     BP 117/84   Pulse 90   Temp 97.9 F (36.6 C)   Ht 5\' 1"  (1.549 m)   Wt 149 lb (67.6 kg)   LMP 02/11/2023 (Approximate)   SpO2 100%   BMI 28.15 kg/m  Wt Readings from Last 3  Encounters:  05/22/23 149 lb (67.6 kg)  05/01/23 148 lb (67.1 kg)  02/15/23 140 lb (63.5 kg)    Physical Exam   Gen: WDWN NAD HEENT: NCAT, conjunctiva not injected, sclera nonicteric MSK: no gross abnormalities.  NEURO: A&O x3.  CN II-XII intact.  PSYCH: normal mood. Good eye contact     Assessment & Plan:  Migraine without aura and without status migrainosus, not intractable Florette Hurry; Take 1 tablet (50 mg total) by mouth daily as needed.  Dispense: 8 tablet; Refill: 3  Class 1 obesity due to excess calories with serious comorbidity and body mass index (BMI) of 30.0 to 30.9 in adult -     Zepbound; Inject 12.5 mg into the skin once a week.  Dispense: 6 mL; Refill: 0  Assessment and Plan Assessment & Plan Migraine   She experiences migraines primarily on the right side, occurring three to four times a month, often triggered by stress. Migraines last three to four days and are associated with nausea, chills, and swollen eyes. Ubrelvy 50 mg effectively aborts migraines when taken at onset, while acetaminophen and Advil are less effective. Insurance issues complicate access to [place], but the 50 mg dose is currently effective and approved. A copay card is available to assist with costs. Prescribe Ubrelvy 50 mg as needed for migraines. Provide a sample if available. Document insurance issues to facilitate approval and utilize the copay card.  Amenorrhea   She reports amenorrhea for two months, potentially due to hormonal changes, stress, weight loss, or as a side effect of Zepbound. A negative pregnancy test last month and her husband's vasectomy reduce the likelihood of pregnancy. Perform a pregnancy test to rule out pregnancy.  Weight management   She is currently on Zepbound 10.5 mg for weight management but has hit a plateau in weight loss despite maintaining diet and exercise. She is interested in increasing the dose to 12.5 mg to overcome the plateau and is aware of the  importance of continuing lifestyle modifications alongside medication adjustments. Insurance has been covering the medication, so a three-month supply will be attempted. Increase Zepbound to 12.5 mg and ensure continuation of diet and exercise regimen. Attempt to obtain a three-month supply of Zepbound.    Return for as sch in May.  Ellsworth Haas, MD

## 2023-05-22 NOTE — Patient Instructions (Signed)
 Will do [place] for [employer]

## 2023-05-23 ENCOUNTER — Other Ambulatory Visit (HOSPITAL_COMMUNITY): Payer: Self-pay

## 2023-05-23 NOTE — Telephone Encounter (Signed)
 Pending

## 2023-05-28 ENCOUNTER — Other Ambulatory Visit (HOSPITAL_COMMUNITY): Payer: Self-pay

## 2023-05-28 NOTE — Telephone Encounter (Signed)
 Pharmacy Patient Advocate Encounter  Received notification from  [employer]  that Prior Authorization for Ubrelvy  50 mg tablets has been APPROVED from 05/25/23 to 05/21/24   PA #/Case ID/Reference #: [id number]

## 2023-05-30 ENCOUNTER — Encounter: Payer: Self-pay | Admitting: Family Medicine

## 2023-05-30 ENCOUNTER — Ambulatory Visit: Admitting: Family Medicine

## 2023-05-30 VITALS — BP 135/81 | HR 109 | Temp 98.1°F | Resp 16 | Ht 61.0 in | Wt 147.0 lb

## 2023-05-30 DIAGNOSIS — F4321 Adjustment disorder with depressed mood: Secondary | ICD-10-CM | POA: Diagnosis not present

## 2023-05-30 MED ORDER — ESCITALOPRAM OXALATE 10 MG PO TABS: 10.0000 mg | ORAL_TABLET | Freq: Every day | ORAL | 1 refills | Status: DC

## 2023-05-30 MED ORDER — LORAZEPAM 0.5 MG PO TABS: 0.5000 mg | ORAL_TABLET | Freq: Three times a day (TID) | ORAL | 0 refills | Status: DC | PRN

## 2023-05-30 NOTE — Progress Notes (Signed)
 Subjective:     Patient ID: Maria Vasquez, female    DOB: 07/23/1980, 43 y.o.   MRN: [id number]  Chief Complaint  Patient presents with   Depression    Having some issues with depression, stopped medication due to side effects, here for a follow-up    HPI Discussed the use of AI scribe software for clinical note transcription with the patient, who gave verbal consent to proceed.  History of Present Illness She is a 43 year old female who presents with worsening depression and anxiety after stopping Wellbutrin .  She has experienced worsening depression and anxiety after discontinuing Wellbutrin  two weeks ago due to severe stomach issues. Her depression is described as 'getting worse,' with frequent crying spells and increased anxiety. She also reports difficulty sleeping and nausea associated with her emotional state.  She is experiencing significant emotional distress due to the impending euthanasia of her dog, who has been diagnosed with cancer. She describes a deep emotional bond with her dog, who has been with her for 13 years, and expresses that 'everything I run is about her.' This situation has exacerbated her depressive symptoms, making it difficult for her to function, including an inability to work due to crying and emotional distress.  No thoughts of suicide. She has a history of depression and was previously on medication years ago, though she does not recall the specific medication. She has not taken Lexapro  in the past.  She has requested time off work to cope with her current emotional state and to be with her family during this difficult time. She plans to return to work on June 05, 2023.  She has not taken lorazepam  before but plans to use it primarily for sleep.    Health Maintenance Due  Topic Date Due   Cervical Cancer Screening (HPV/Pap Cotest)  Never done    Past Medical History:  Diagnosis Date   Hypertension    Thyroid  disease     Past Surgical  History:  Procedure Laterality Date   COSMETIC SURGERY  2022   tummy tuck     Current Outpatient Medications:    escitalopram  (LEXAPRO ) 10 MG tablet, Take 1 tablet (10 mg total) by mouth daily., Disp: 30 tablet, Rfl: 1   hydrochlorothiazide  (MICROZIDE ) 12.5 MG capsule, 1 capsule in the morning Orally Once a day for 30 day(s), Disp: 90 capsule, Rfl: 1   levothyroxine  (SYNTHROID ) 100 MCG tablet, Take 1 tablet (100 mcg total) by mouth daily before breakfast., Disp: 90 tablet, Rfl: 1   LORazepam  (ATIVAN ) 0.5 MG tablet, Take 1 tablet (0.5 mg total) by mouth every 8 (eight) hours as needed for anxiety., Disp: 20 tablet, Rfl: 0   tirzepatide  (ZEPBOUND ) 12.5 MG/0.5ML Pen, Inject 12.5 mg into the skin once a week., Disp: 6 mL, Rfl: 0   UBRELVY  50 MG TABS, Take 1 tablet (50 mg total) by mouth daily as needed., Disp: 8 tablet, Rfl: 3  Allergies  Allergen Reactions   Lorcaserin Other (See Comments)   ROS neg/noncontributory except as noted HPI/below      Objective:     BP 135/81   Pulse (!) 109   Temp 98.1 F (36.7 C) (Temporal)   Resp 16   Ht 5\' 1"  (1.549 m)   Wt 147 lb (66.7 kg)   LMP 02/11/2023 (Approximate)   SpO2 100%   BMI 27.78 kg/m  Wt Readings from Last 3 Encounters:  05/30/23 147 lb (66.7 kg)  05/22/23 149 lb (67.6 kg)  05/01/23 148 lb (67.1  kg)    Physical Exam   Gen: WDWN NAD HEENT: NCAT, conjunctiva not injected, sclera nonicteric MSK: no gross abnormalities.  NEURO: A&O x3.  CN II-XII intact.  PSYCH: tearful  mood. Good eye contact  Pdmp checked     Assessment & Plan:  Adjustment disorder with depressed mood  Other orders -     Escitalopram  Oxalate; Take 1 tablet (10 mg total) by mouth daily.  Dispense: 30 tablet; Refill: 1 -     LORazepam ; Take 1 tablet (0.5 mg total) by mouth every 8 (eight) hours as needed for anxiety.  Dispense: 20 tablet; Refill: 0  Assessment and Plan Assessment & Plan Depression   Her depression has worsened after stopping  Wellbutrin  due to gastrointestinal side effects. She experiences increased crying, nausea, anxiety, and insomnia, worsened by the emotional stress of euthanizing her dog. No suicidal ideation is reported. Lexapro  (escitalopram ) is recommended for long-term management due to its faster onset and better tolerance in women. Potential side effects, including dizziness, nausea, loose stools, decreased appetite, dry mouth, decreased libido, and delayed orgasm, were discussed. Lorazepam  is considered for short-term management of anxiety and insomnia, with caution regarding its sedative effects and potential for dependency. Prescribe Lexapro  for long-term management. Prescribe lorazepam  for short-term use, advising use primarily at night to aid sleep. Advise against regular use of lorazepam  to prevent dependency. Discuss potential side effects of both medications. Advise seeking emergency care if suicidal thoughts occur.    Return for canc May 6.  sch around May 20th, chronic follow-up.  Ellsworth Haas, MD

## 2023-05-30 NOTE — Patient Instructions (Signed)

## 2023-06-12 ENCOUNTER — Ambulatory Visit: Payer: BC Managed Care – PPO | Admitting: Family Medicine

## 2023-06-21 ENCOUNTER — Other Ambulatory Visit: Payer: Self-pay | Admitting: Family Medicine

## 2023-06-24 ENCOUNTER — Other Ambulatory Visit: Payer: Self-pay | Admitting: Family Medicine

## 2023-06-24 DIAGNOSIS — E038 Other specified hypothyroidism: Secondary | ICD-10-CM

## 2023-06-26 ENCOUNTER — Ambulatory Visit: Admitting: Family Medicine

## 2023-08-13 ENCOUNTER — Ambulatory Visit (INDEPENDENT_AMBULATORY_CARE_PROVIDER_SITE_OTHER)

## 2023-08-13 ENCOUNTER — Ambulatory Visit: Payer: Self-pay | Admitting: Family Medicine

## 2023-08-13 ENCOUNTER — Ambulatory Visit: Admitting: Family Medicine

## 2023-08-13 ENCOUNTER — Encounter: Payer: Self-pay | Admitting: Family Medicine

## 2023-08-13 VITALS — BP 124/79 | HR 72 | Temp 97.9°F | Resp 16 | Ht 61.0 in | Wt 143.2 lb

## 2023-08-13 DIAGNOSIS — R5383 Other fatigue: Secondary | ICD-10-CM | POA: Diagnosis not present

## 2023-08-13 DIAGNOSIS — R071 Chest pain on breathing: Secondary | ICD-10-CM | POA: Diagnosis not present

## 2023-08-13 DIAGNOSIS — F4321 Adjustment disorder with depressed mood: Secondary | ICD-10-CM | POA: Diagnosis not present

## 2023-08-13 DIAGNOSIS — R0789 Other chest pain: Secondary | ICD-10-CM | POA: Diagnosis not present

## 2023-08-13 DIAGNOSIS — E038 Other specified hypothyroidism: Secondary | ICD-10-CM | POA: Diagnosis not present

## 2023-08-13 LAB — CBC WITH DIFFERENTIAL/PLATELET
Basophils Absolute: 0.1 K/uL (ref 0.0–0.1)
Basophils Relative: 1 % (ref 0.0–3.0)
Eosinophils Absolute: 0.2 K/uL (ref 0.0–0.7)
Eosinophils Relative: 2.5 % (ref 0.0–5.0)
HCT: 38.5 % (ref 36.0–46.0)
Hemoglobin: 13.3 g/dL (ref 12.0–15.0)
Lymphocytes Relative: 27.1 % (ref 12.0–46.0)
Lymphs Abs: 1.7 K/uL (ref 0.7–4.0)
MCHC: 34.7 g/dL (ref 30.0–36.0)
MCV: 89.7 fl (ref 78.0–100.0)
Monocytes Absolute: 0.6 K/uL (ref 0.1–1.0)
Monocytes Relative: 9.6 % (ref 3.0–12.0)
Neutro Abs: 3.7 K/uL (ref 1.4–7.7)
Neutrophils Relative %: 59.8 % (ref 43.0–77.0)
Platelets: 381 K/uL (ref 150.0–400.0)
RBC: 4.29 Mil/uL (ref 3.87–5.11)
RDW: 13.7 % (ref 11.5–15.5)
WBC: 6.1 K/uL (ref 4.0–10.5)

## 2023-08-13 LAB — TSH: TSH: 0.88 u[IU]/mL (ref 0.35–5.50)

## 2023-08-13 LAB — COMPREHENSIVE METABOLIC PANEL WITH GFR
ALT: 30 U/L (ref 0–35)
AST: 21 U/L (ref 0–37)
Albumin: 4.6 g/dL (ref 3.5–5.2)
Alkaline Phosphatase: 53 U/L (ref 39–117)
BUN: 22 mg/dL (ref 6–23)
CO2: 31 meq/L (ref 19–32)
Calcium: 9.8 mg/dL (ref 8.4–10.5)
Chloride: 101 meq/L (ref 96–112)
Creatinine, Ser: 0.72 mg/dL (ref 0.40–1.20)
GFR: 101.3 mL/min (ref >60.00)
Glucose, Bld: 95 mg/dL (ref 70–99)
Potassium: 3.7 meq/L (ref 3.5–5.1)
Sodium: 139 meq/L (ref 135–145)
Total Bilirubin: 0.6 mg/dL (ref 0.2–1.2)
Total Protein: 7.8 g/dL (ref 6.0–8.3)

## 2023-08-13 MED ORDER — ESCITALOPRAM OXALATE 10 MG PO TABS: 10.0000 mg | ORAL_TABLET | Freq: Every day | ORAL | 1 refills | Status: DC

## 2023-08-13 MED ORDER — BUPROPION HCL ER (XL) 150 MG PO TB24
150.0000 mg | ORAL_TABLET | Freq: Every day | ORAL | 1 refills | Status: DC
Start: 2023-08-13 — End: 2023-09-11

## 2023-08-13 NOTE — Progress Notes (Signed)
 Labs normal.

## 2023-08-13 NOTE — Patient Instructions (Signed)
 Ibuprofen 3x/day for 3-7 days  Lexapro  daily and start the wellbutrin 

## 2023-08-13 NOTE — Progress Notes (Signed)
 Subjective:     Patient ID: Maria Vasquez, female    DOB: Jul 27, 1980, 43 y.o.   MRN: [id number]  Chief Complaint  Patient presents with   Depression   Anxiety    Still having anxiety and depression, doesn't feel like medication is working    HPI Discussed the use of AI scribe software for clinical note transcription with the patient, who gave verbal consent to proceed.  History of Present Illness The patient presents with worsening depression and anxiety.  Her depression and anxiety have been worsening, leading to increased emotional distress and crying spells. She feels overwhelmed by small things and unable to handle them. She has been attending counseling sessions twice and is currently taking Lexapro  (escitalopram ), but she feels very sleepy and drowsy, which affects her ability to work. As a result, she has been taking the medication every two to three days instead of daily.  She describes experiencing chest pain for the past two weeks, associated with a work-related incident involving lifting resident. The pain is located to the left of the sternum and is worsened by movement. She reports a heavy sensation in her chest and difficulty breathing, describing it as 'like I cannot have air inside my lungs.' No recent long car trips or specific injuries. She has not been taking any anti-inflammatory medications like ibuprofen or Aleve for the chest pain.  She is experiencing significant stress due to personal issues, including problems with her husband and the recent death of her dog a few months ago. She feels unsupported and struggles to express her emotions, often suppressing them in front of her daughters, aged 8 and 22. Her older daughter encourages her to express her emotions, but she finds it difficult to do so.  She works long hours, up to sixteen hours a day, which contributes to her fatigue and stress. She expresses a need for time off work to focus on her mental health and  spend time with her daughters. Having hard time working.  No SI    Health Maintenance Due  Topic Date Due   Hepatitis B Vaccines (1 of 3 - 19+ 3-dose series) Never done   Cervical Cancer Screening (HPV/Pap Cotest)  Never done    Past Medical History:  Diagnosis Date   Hypertension    Thyroid  disease     Past Surgical History:  Procedure Laterality Date   COSMETIC SURGERY  2022   tummy tuck     Current Outpatient Medications:    buPROPion  (WELLBUTRIN  XL) 150 MG 24 hr tablet, Take 1 tablet (150 mg total) by mouth daily., Disp: 30 tablet, Rfl: 1   hydrochlorothiazide  (MICROZIDE ) 12.5 MG capsule, 1 capsule in the morning Orally Once a day for 30 day(s), Disp: 90 capsule, Rfl: 1   levothyroxine  (SYNTHROID ) 100 MCG tablet, TAKE 1 TABLET BY MOUTH DAILY BEFORE BREAKFAST., Disp: 90 tablet, Rfl: 0   LORazepam  (ATIVAN ) 0.5 MG tablet, Take 1 tablet (0.5 mg total) by mouth every 8 (eight) hours as needed for anxiety., Disp: 20 tablet, Rfl: 0   tirzepatide  (ZEPBOUND ) 12.5 MG/0.5ML Pen, Inject 12.5 mg into the skin once a week., Disp: 6 mL, Rfl: 0   UBRELVY  50 MG TABS, Take 1 tablet (50 mg total) by mouth daily as needed., Disp: 8 tablet, Rfl: 3   escitalopram  (LEXAPRO ) 10 MG tablet, Take 1 tablet (10 mg total) by mouth daily., Disp: 30 tablet, Rfl: 1  Allergies  Allergen Reactions   Lorcaserin Other (See Comments)   ROS  neg/noncontributory except as noted HPI/below      Objective:     BP 124/79   Pulse 72   Temp 97.9 F (36.6 C) (Temporal)   Resp 16   Ht 5' 1 (1.549 m)   Wt 143 lb 4 oz (65 kg)   LMP  (LMP Unknown) Comment: about 3 months ago  SpO2 100%   BMI 27.07 kg/m  Wt Readings from Last 3 Encounters:  08/13/23 143 lb 4 oz (65 kg)  05/30/23 147 lb (66.7 kg)  05/22/23 149 lb (67.6 kg)    Physical Exam   Gen: WDWN NAD HEENT: NCAT, conjunctiva not injected, sclera nonicteric CARDIAC: RRR, S1S2+, no murmur. + tenderness to palp, L of sternal border/ant chest  wall LUNGS: CTAB. No wheezes EXT:  no edema MSK: no gross abnormalities.  NEURO: A&O x3.  CN II-XII intact.  PSYCH: tearful mood. Good eye contact     Assessment & Plan:  Adjustment disorder with depressed mood  Other fatigue -     CBC with Differential/Platelet -     Comprehensive metabolic panel with GFR  Other specified hypothyroidism -     TSH -     Comprehensive metabolic panel with GFR  Chest pain on breathing -     DG Chest 2 View; Future  Other orders -     Escitalopram  Oxalate; Take 1 tablet (10 mg total) by mouth daily.  Dispense: 30 tablet; Refill: 1 -     buPROPion  HCl ER (XL); Take 1 tablet (150 mg total) by mouth daily.  Dispense: 30 tablet; Refill: 1  Assessment and Plan Assessment & Plan Anxiety and Depression   Her anxiety and depression have worsened, with increased crying spells and difficulty managing stressors. She is currently on escitalopram  but takes it inconsistently due to drowsiness. Significant stress stems from work, personal life, marital issues, and recent pet loss. Consistent medication use and combining escitalopram  with bupropion  were discussed, as this may address both anxiety and depressive symptoms. Initial increased drowsiness is possible but may improve with time and reduced work stress. Prescribe escitalopram  daily and restart bupropion  in the morning. Encourage continuation of counseling sessions. Consider referral to a psychiatrist if needed. Approve six weeks off work for mental health recovery.  Chest Pain   She experiences chest pain localized to the left of the sternum, exacerbated by movement and palpation, ongoing for two weeks. It is likely musculoskeletal, possibly costochondritis or muscle strain from lifting. Pleurisy is considered, has normal oxygen saturation . Ibuprofen is recommended for inflammation, and a chest x-ray is needed to rule out unusual findings. Instruct to take ibuprofen three times daily for three to seven  days.  Thyroid  Disorder   She is on thyroid  medication, but thyroid  function has not been checked since November. It is necessary to ensure thyroid  levels are not contributing to fatigue and depression. Order thyroid  function tests to assess current levels.  Follow-up   A follow-up is required to assess the effectiveness of medication adjustments and overall mental health status. Schedule a follow-up appointment in four weeks to re-evaluate mental health status and medication effectiveness.    Return in about 4 weeks (around 09/10/2023) for mood.  Jenkins CHRISTELLA Carrel, MD

## 2023-08-13 NOTE — Progress Notes (Signed)
 Xray ok.  Suspect costochondritis(pulled cartilege).  Take ibuprofen as discussed

## 2023-08-23 ENCOUNTER — Other Ambulatory Visit: Payer: Self-pay | Admitting: Family Medicine

## 2023-08-23 DIAGNOSIS — I10 Essential (primary) hypertension: Secondary | ICD-10-CM

## 2023-09-11 ENCOUNTER — Ambulatory Visit: Admitting: Family Medicine

## 2023-09-11 ENCOUNTER — Encounter: Payer: Self-pay | Admitting: Family Medicine

## 2023-09-11 VITALS — BP 120/81 | HR 71 | Temp 97.7°F | Resp 18 | Ht 61.0 in | Wt 148.1 lb

## 2023-09-11 DIAGNOSIS — E038 Other specified hypothyroidism: Secondary | ICD-10-CM

## 2023-09-11 DIAGNOSIS — E66811 Obesity, class 1: Secondary | ICD-10-CM | POA: Diagnosis not present

## 2023-09-11 DIAGNOSIS — E6609 Other obesity due to excess calories: Secondary | ICD-10-CM | POA: Diagnosis not present

## 2023-09-11 DIAGNOSIS — F4321 Adjustment disorder with depressed mood: Secondary | ICD-10-CM

## 2023-09-11 DIAGNOSIS — Z6830 Body mass index (BMI) 30.0-30.9, adult: Secondary | ICD-10-CM

## 2023-09-11 MED ORDER — ZEPBOUND 12.5 MG/0.5ML ~~LOC~~ SOAJ: 12.5000 mg | SUBCUTANEOUS | 0 refills | Status: DC

## 2023-09-11 MED ORDER — LEVOTHYROXINE SODIUM 100 MCG PO TABS: 100.0000 ug | ORAL_TABLET | Freq: Every day | ORAL | 1 refills | Status: DC

## 2023-09-11 MED ORDER — ESCITALOPRAM OXALATE 10 MG PO TABS: 10.0000 mg | ORAL_TABLET | Freq: Every day | ORAL | 1 refills | Status: AC

## 2023-09-11 MED ORDER — BUPROPION HCL ER (XL) 150 MG PO TB24: 150.0000 mg | ORAL_TABLET | Freq: Every day | ORAL | 1 refills | Status: AC

## 2023-09-11 NOTE — Progress Notes (Signed)
 Subjective:     Patient ID: Maria Vasquez, female    DOB: Dec 28, 1980, 43 y.o.   MRN: [id number]  Chief Complaint  Patient presents with   Medical Management of Chronic Issues    4 week follow-up    HPI Discussed the use of AI scribe software for clinical note transcription with the patient, who gave verbal consent to proceed.  History of Present Illness Maria Vasquez is a 43 year old female who presents for follow-up on her mental health management.  She has been taking Lexapro  10 mg and Wellbutrin  150 mg for depression and anxiety. The initial two weeks of medication adjustment were challenging, but she noticed improvement in the third and fourth weeks. She reports no suicidal ideation and a reduction in crying episodes, although grieving remains difficult. No SI  She has been off work, which has allowed her time to heal and engage in introspection. Being at home has been beneficial for her mental health.  There is a family history of colon cancer, as her father was diagnosed at around age 23. She has previously undergone an endoscopy approximately ten years ago due to colon issues.    Health Maintenance Due  Topic Date Due   Cervical Cancer Screening (HPV/Pap Cotest)  Never done   Colonoscopy  Never done    Past Medical History:  Diagnosis Date   Hypertension    Thyroid  disease     Past Surgical History:  Procedure Laterality Date   COSMETIC SURGERY  2022   tummy tuck     Current Outpatient Medications:    hydrochlorothiazide  (MICROZIDE ) 12.5 MG capsule, 1 CAPSULE IN THE MORNING ORALLY ONCE A DAY FOR 30 DAY(S), Disp: 90 capsule, Rfl: 1   LORazepam  (ATIVAN ) 0.5 MG tablet, Take 1 tablet (0.5 mg total) by mouth every 8 (eight) hours as needed for anxiety., Disp: 20 tablet, Rfl: 0   UBRELVY  50 MG TABS, Take 1 tablet (50 mg total) by mouth daily as needed., Disp: 8 tablet, Rfl: 3   buPROPion  (WELLBUTRIN  XL) 150 MG 24 hr tablet, Take 1 tablet (150 mg total) by mouth  daily., Disp: 90 tablet, Rfl: 1   escitalopram  (LEXAPRO ) 10 MG tablet, Take 1 tablet (10 mg total) by mouth daily., Disp: 90 tablet, Rfl: 1   levothyroxine  (SYNTHROID ) 100 MCG tablet, Take 1 tablet (100 mcg total) by mouth daily before breakfast., Disp: 90 tablet, Rfl: 1   tirzepatide  (ZEPBOUND ) 12.5 MG/0.5ML Pen, Inject 12.5 mg into the skin once a week., Disp: 6 mL, Rfl: 0  Allergies  Allergen Reactions   Lorcaserin Other (See Comments)   ROS neg/noncontributory except as noted HPI/below      Objective:     BP 120/81   Pulse 71   Temp 97.7 F (36.5 C) (Temporal)   Resp 18   Ht 5' 1 (1.549 m)   Wt 148 lb 2 oz (67.2 kg)   LMP 05/08/2023 Comment: about 3 months ago  SpO2 100%   BMI 27.99 kg/m  Wt Readings from Last 3 Encounters:  09/11/23 148 lb 2 oz (67.2 kg)  08/13/23 143 lb 4 oz (65 kg)  05/30/23 147 lb (66.7 kg)    Physical Exam   Gen: WDWN NAD HEENT: NCAT, conjunctiva not injected, sclera nonicteric NECK:  supple, no thyromegaly, no nodes, no carotid bruits CARDIAC: RRR, S1S2+, no murmur. LUNGS: CTAB. No wheezes EXT:  no edema MSK: no gross abnormalities.  NEURO: A&O x3.  CN II-XII intact.  PSYCH: normal mood.  Good eye contact     Assessment & Plan:  Adjustment disorder with depressed mood -     buPROPion  HCl ER (XL); Take 1 tablet (150 mg total) by mouth daily.  Dispense: 90 tablet; Refill: 1 -     Escitalopram  Oxalate; Take 1 tablet (10 mg total) by mouth daily.  Dispense: 90 tablet; Refill: 1  Other specified hypothyroidism -     Levothyroxine  Sodium; Take 1 tablet (100 mcg total) by mouth daily before breakfast.  Dispense: 90 tablet; Refill: 1  Class 1 obesity due to excess calories with serious comorbidity and body mass index (BMI) of 30.0 to 30.9 in adult -     Zepbound ; Inject 12.5 mg into the skin once a week.  Dispense: 6 mL; Refill: 0  Assessment and Plan Assessment & Plan Depression and anxiety   Depression and anxiety are managed with  Lexapro  10 mg and Wellbutrin  150 mg. She has been off work, allowing time for healing. Initial two weeks were challenging due to medication adjustment, but improvement was noted in the third and fourth weeks. No suicidal ideation reported, though occasional crying persists. Grieving process ongoing. Current medication dosage is appropriate, with no immediate need for adjustments. Plans to return to work for regular eight-hour shifts without taking extra shifts to manage stress and mental health. Continue Lexapro  10 mg and Wellbutrin  150 mg. Prescribe a 90-day supply of both medications. Advise returning to work for regular shifts without extra hours. Encourage messaging if medication adjustments are needed.  Hypothyroidism   Hypothyroidism is managed with thyroid  medication. She reports the dosage is appropriate and requires renewal of the prescription. Renew thyroid  medication prescription.  Wt-doing well on zepbound -renewed    Return in about 3 months (around 12/12/2023) for mood.  Jenkins CHRISTELLA Carrel, MD

## 2023-09-11 NOTE — Patient Instructions (Signed)

## 2023-09-13 ENCOUNTER — Encounter: Payer: Self-pay | Admitting: Family Medicine

## 2023-09-18 DIAGNOSIS — Z0279 Encounter for issue of other medical certificate: Secondary | ICD-10-CM

## 2023-10-16 ENCOUNTER — Encounter: Payer: Self-pay | Admitting: Family Medicine

## 2023-10-16 ENCOUNTER — Ambulatory Visit: Admitting: Family Medicine

## 2023-10-16 VITALS — BP 115/79 | HR 72 | Temp 98.0°F | Resp 16 | Ht 61.0 in | Wt 148.1 lb

## 2023-10-16 DIAGNOSIS — N912 Amenorrhea, unspecified: Secondary | ICD-10-CM

## 2023-10-16 NOTE — Patient Instructions (Signed)
 It was very nice to see you today!  Congrats on puppy!   PLEASE NOTE:  If you had any lab tests please let us  know if you have not heard back within a few days. You may see your results on MyChart before we have a chance to review them but we will give you a call once they are reviewed by us . If we ordered any referrals today, please let us  know if you have not heard from their office within the next week.   Please try these tips to maintain a healthy lifestyle:  Eat most of your calories during the day when you are active. Eliminate processed foods including packaged sweets (pies, cakes, cookies), reduce intake of potatoes, white bread, white pasta, and white rice. Look for whole grain options, oat flour or almond flour.  Each meal should contain half fruits/vegetables, one quarter protein, and one quarter carbs (no bigger than a computer mouse).  Cut down on sweet beverages. This includes juice, soda, and sweet tea. Also watch fruit intake, though this is a healthier sweet option, it still contains natural sugar! Limit to 3 servings daily.  Drink at least 1 glass of water with each meal and aim for at least 8 glasses per day  Exercise at least 150 minutes every week.

## 2023-10-16 NOTE — Progress Notes (Signed)
 Subjective:     Patient ID: Maria Vasquez, female    DOB: April 19, 1980, 43 y.o.   MRN: [id number]  Chief Complaint  Patient presents with   Menstrual Problem    Haven't had period in 5 months, was regular before, sometimes has abdominal pain     HPI Discussed the use of AI scribe software for clinical note transcription with the patient, who gave verbal consent to proceed.  History of Present Illness Maria Vasquez is a 43 year old female who presents with amenorrhea for five months.  She has experienced amenorrhea for five months, with previously regular monthly menstrual cycles. She reports that her husband has had a vasectomy and that she checked a pregnancy test, which was negative.  She experiences occasional mild lower abdominal pain, described as 'very low pain' and not severe. No pain with intercourse and no spotting are reported.  She reports experiencing hot flashes that are brief, lasting only seconds, and are relieved by drinking water. These symptoms are inconsistent and do not occur daily.  Her thyroid  function was checked in July and was normal. She has not had a Pap smear in several years.  She takes magnesium, vitamin B, B12, and vitamin D supplements. She consumes cheese and yogurt regularly but not much milk, and she eats vegetables, suggesting adequate calcium intake from her diet.  She lives with her daughters, who have regular menstrual cycles. She recently got a new puppy, which has been a positive addition to her family life.    Health Maintenance Due  Topic Date Due   Cervical Cancer Screening (HPV/Pap Cotest)  Never done   Colonoscopy  Never done    Past Medical History:  Diagnosis Date   Hypertension    Thyroid  disease     Past Surgical History:  Procedure Laterality Date   COSMETIC SURGERY  2022   tummy tuck     Current Outpatient Medications:    buPROPion  (WELLBUTRIN  XL) 150 MG 24 hr tablet, Take 1 tablet (150 mg total) by mouth daily.,  Disp: 90 tablet, Rfl: 1   escitalopram  (LEXAPRO ) 10 MG tablet, Take 1 tablet (10 mg total) by mouth daily., Disp: 90 tablet, Rfl: 1   hydrochlorothiazide  (MICROZIDE ) 12.5 MG capsule, 1 CAPSULE IN THE MORNING ORALLY ONCE A DAY FOR 30 DAY(S), Disp: 90 capsule, Rfl: 1   levothyroxine  (SYNTHROID ) 100 MCG tablet, Take 1 tablet (100 mcg total) by mouth daily before breakfast., Disp: 90 tablet, Rfl: 1   LORazepam  (ATIVAN ) 0.5 MG tablet, Take 1 tablet (0.5 mg total) by mouth every 8 (eight) hours as needed for anxiety., Disp: 20 tablet, Rfl: 0   tirzepatide  (ZEPBOUND ) 12.5 MG/0.5ML Pen, Inject 12.5 mg into the skin once a week., Disp: 6 mL, Rfl: 0   UBRELVY  50 MG TABS, Take 1 tablet (50 mg total) by mouth daily as needed., Disp: 8 tablet, Rfl: 3  Allergies  Allergen Reactions   Lorcaserin Other (See Comments)   ROS neg/noncontributory except as noted HPI/below      Objective:     BP 115/79   Pulse 72   Temp 98 F (36.7 C) (Temporal)   Resp 16   Ht 5' 1 (1.549 m)   Wt 148 lb 2 oz (67.2 kg)   SpO2 100%   BMI 27.99 kg/m  Wt Readings from Last 3 Encounters:  10/16/23 148 lb 2 oz (67.2 kg)  09/11/23 148 lb 2 oz (67.2 kg)  08/13/23 143 lb 4 oz (65 kg)  Physical Exam   Gen: WDWN NAD HEENT: NCAT, conjunctiva not injected, sclera nonicteric ABDOMEN:  BS+, soft, NTND, No HSM, no masses EXT:  no edema MSK: no gross abnormalities.  NEURO: A&O x3.  CN II-XII intact.  PSYCH: normal mood. Good eye contact     Assessment & Plan:  Amenorrhea  Assessment and Plan Assessment & Plan Secondary amenorrhea and perimenopausal symptoms   Amenorrhea has persisted for five months following previously regular menstrual cycles, with a negative pregnancy test. Given her age of 41 and symptoms like intermittent hot flashes, perimenopause is likely. Thyroid  function tests were normal two months ago. Differential diagnosis includes stress and menopause. Hot flashes are mild, infrequent, and resolve  with hydration. She reports no significant abdominal pain, spotting, or pain with intercourse. Hormone replacement therapy is not immediately necessary as symptoms are manageable, and she prefers natural management unless symptoms worsen. Monitor symptoms and menstrual cycle. Ensure adequate calcium and vitamin D intake through diet or supplements. Encourage regular physical activity. Consider hormone replacement therapy if symptoms worsen or become bothersome.    Return for as sch in Nov.  Jenkins CHRISTELLA Carrel, MD

## 2023-12-12 ENCOUNTER — Encounter: Payer: Self-pay | Admitting: Family Medicine

## 2023-12-12 ENCOUNTER — Ambulatory Visit: Admitting: Family Medicine

## 2023-12-12 VITALS — BP 114/60 | HR 77 | Temp 97.7°F | Ht 61.0 in | Wt 149.4 lb

## 2023-12-12 DIAGNOSIS — G43009 Migraine without aura, not intractable, without status migrainosus: Secondary | ICD-10-CM | POA: Diagnosis not present

## 2023-12-12 DIAGNOSIS — E038 Other specified hypothyroidism: Secondary | ICD-10-CM

## 2023-12-12 DIAGNOSIS — E6609 Other obesity due to excess calories: Secondary | ICD-10-CM

## 2023-12-12 DIAGNOSIS — F4321 Adjustment disorder with depressed mood: Secondary | ICD-10-CM | POA: Diagnosis not present

## 2023-12-12 DIAGNOSIS — K760 Fatty (change of) liver, not elsewhere classified: Secondary | ICD-10-CM

## 2023-12-12 DIAGNOSIS — E66811 Other obesity due to excess calories: Secondary | ICD-10-CM

## 2023-12-12 DIAGNOSIS — Z1212 Encounter for screening for malignant neoplasm of rectum: Secondary | ICD-10-CM

## 2023-12-12 DIAGNOSIS — Z6830 Body mass index (BMI) 30.0-30.9, adult: Secondary | ICD-10-CM

## 2023-12-12 DIAGNOSIS — Z1211 Encounter for screening for malignant neoplasm of colon: Secondary | ICD-10-CM

## 2023-12-12 DIAGNOSIS — K5904 Chronic idiopathic constipation: Secondary | ICD-10-CM

## 2023-12-12 MED ORDER — ZEPBOUND 12.5 MG/0.5ML ~~LOC~~ SOAJ: 12.5000 mg | SUBCUTANEOUS | 1 refills | Status: AC

## 2023-12-12 NOTE — Patient Instructions (Addendum)
 Colace 100mg  1-2/day every day and can do miralax daily.     if biscodylol-shouldn't be doing that.

## 2023-12-12 NOTE — Progress Notes (Signed)
 Subjective:     Patient ID: Maria Vasquez, female    DOB: 1980-11-03, 43 y.o.   MRN: [id number]  Chief Complaint  Patient presents with   Hypertension   Hypothyroidism    Pt is here to go over chronic issues, also pt would like you to do a pap advised need another appt (Physical)    Discussed the use of AI scribe software for clinical note transcription with the patient, who gave verbal consent to proceed.  History of Present Illness Maria Vasquez is a 43 year old female with depression, anxiety, hypothyroidism, hypertension, constipation, and migraines who presents for follow-up on her mental health and gastrointestinal issues.  She is currently taking Lexapro  10 mg and bupropion  150 mg for depression and anxiety, which she finds effective. She reports feeling better and does not require any dose adjustments at this time.  She experiences allergy symptoms, specifically sniffles, which she attributes to dust and heat in her home. She prefers not to take allergy medications like Claritin to avoid additional pills.  For her thyroid  condition, she continues to take levothyroxine  100 mcg daily.   She has stopped taking hydrochlorothiazide , which she previously used for blood pressure management. She has not refilled this medication since October and monitors her blood pressure regularly. She experiences swelling in her hands.  She continues to use Ubrelvy  for migraine management, which she finds effective. She notes that taking the medication requires her to rest and sleep for it to be effective. She has a few refills left and uses the medication as needed when she experiences severe headaches.  She reports significant issues with constipation, experiencing bleeding and pain during bowel movements. She has a history of constipation for years and has tried various treatments including Miralax and Metamucil without success. She drinks water throughout the day and limits her intake of other  beverages. She occasionally uses a small pill provided by a nurse at her job, which helps her go to the bathroom without problems, but she is concerned about its long-term use. She experiences abdominal swelling and tenderness, which she associates with her constipation issues.  She mentions dietary habits, stating she eats one meal a day, typically breakfast, and consumes salads and sushi. Despite these efforts, she experiences abdominal swelling after eating any kind of food.    Health Maintenance Due  Topic Date Due   Cervical Cancer Screening (HPV/Pap Cotest)  Never done   Mammogram  Never done   Colonoscopy  Never done    Past Medical History:  Diagnosis Date   Hypertension    Thyroid  disease     Past Surgical History:  Procedure Laterality Date   COSMETIC SURGERY  2022   tummy tuck     Current Outpatient Medications:    buPROPion  (WELLBUTRIN  XL) 150 MG 24 hr tablet, Take 1 tablet (150 mg total) by mouth daily., Disp: 90 tablet, Rfl: 1   escitalopram  (LEXAPRO ) 10 MG tablet, Take 1 tablet (10 mg total) by mouth daily., Disp: 90 tablet, Rfl: 1   hydrochlorothiazide  (MICROZIDE ) 12.5 MG capsule, 1 CAPSULE IN THE MORNING ORALLY ONCE A DAY FOR 30 DAY(S), Disp: 90 capsule, Rfl: 1   levothyroxine  (SYNTHROID ) 100 MCG tablet, Take 1 tablet (100 mcg total) by mouth daily before breakfast., Disp: 90 tablet, Rfl: 1   UBRELVY  50 MG TABS, Take 1 tablet (50 mg total) by mouth daily as needed., Disp: 8 tablet, Rfl: 3   tirzepatide  (ZEPBOUND ) 12.5 MG/0.5ML Pen, Inject 12.5 mg into the  skin once a week., Disp: 6 mL, Rfl: 1  Allergies  Allergen Reactions   Lorcaserin Other (See Comments)   ROS neg/noncontributory except as noted HPI/below      Objective:     BP 114/60 (BP Location: Left Arm, Patient Position: Sitting, Cuff Size: Normal)   Pulse 77   Temp 97.7 F (36.5 C) (Temporal)   Ht 5' 1 (1.549 m)   Wt 149 lb 6 oz (67.8 kg)   LMP  (LMP Unknown)   SpO2 98%   BMI 28.22 kg/m   Wt Readings from Last 3 Encounters:  12/12/23 149 lb 6 oz (67.8 kg)  10/16/23 148 lb 2 oz (67.2 kg)  09/11/23 148 lb 2 oz (67.2 kg)    Physical Exam GENERAL: Well developed, well nourished, no acute distress. HEAD EYES EARS NOSE THROAT: Normocephalic, atraumatic, conjunctiva not injected, sclera nonicteric. CARDIAC: Regular rate and rhythm, S1 S2 present, no murmur, dorsalis pedis 2 plus bilaterally. NECK: Supple, no thyromegaly, no nodes, no carotid bruits. LUNGS: Clear to auscultation bilaterally, no wheezes. ABDOMEN: Bowel sounds present, soft, diffusely tender to palpation, non distended, no hepatosplenomegaly, no masses. EXTREMITIES: No edema. MUSCULOSKELETAL: No gross abnormalities. NEUROLOGICAL: Alert and oriented x3, cranial nerves II through XII intact. PSYCHIATRIC: Normal mood, good eye contact.       Assessment & Plan:  Other specified hypothyroidism  Adjustment disorder with depressed mood  Migraine without aura and without status migrainosus, not intractable  NAFLD (nonalcoholic fatty liver disease)  Class 1 obesity due to excess calories with serious comorbidity and body mass index (BMI) of 30.0 to 30.9 in adult -     Zepbound ; Inject 12.5 mg into the skin once a week.  Dispense: 6 mL; Refill: 1  Chronic idiopathic constipation  Screening for colorectal cancer -     Ambulatory referral to Gastroenterology    Assessment and Plan Assessment & Plan Chronic idiopathic constipation with abdominal bloating and occ rectal bleeding   Chronic constipation with abdominal bloating and rectal bleeding persists despite previous treatments like Miralax and Metamucil. She experiences significant discomfort and swelling. Occasionally uses a small red pill, possibly bisacodyl, which is not recommended for regular use. Colace may be a safer alternative for daily use. Abdominal massage is suggested to stimulate bowel movement. Check current medication for constipation. Consider  Colace 100 mg, 1-2 per day, and Miralax daily, possibly twice a day. Refer for colonoscopy. Advise abdominal massage.  Encounter for screening for malignant neoplasm of colon   Colonoscopy is indicated due to rectal bleeding and chronic constipation. Last colonoscopy was several years ago. Refer for colonoscopy.  Depression and anxiety   Depression and anxiety are well-managed with current medication regimen. She reports feeling better and does not require dose adjustment. Continue Lexapro  10 mg daily and bupropion  150 mg daily.  Obesity, class 1   Obesity management is ongoing with Zepbound , which is reported to be working well. Continue Zepbound  as prescribed.  Migraine without aura   Migraine episodes are managed with Ubrelvy , which is effective when taken with rest. Insurance issues have been addressed, and she has a few refills left. Continue Ubrelvy  as needed for migraine episodes. Request pharmacy refill when supply is low.  Hypertension   She has discontinued hydrochlorothiazide . Blood pressure is currently well-controlled without medication. Experiences swelling in hands, suggesting use of hydrochlorothiazide  as needed for fluid management. Monitor blood pressure monthly. Use hydrochlorothiazide  as needed for swelling.  Other specified hypothyroidism   Hypothyroidism is well-managed on current levothyroxine   dose. Continue levothyroxine  100 mcg daily.  Allergic rhinitis   Experiencing allergy symptoms due to dust and heat exposure. Prefers not to take additional medication due to current pill burden.     Return in about 3 months (around 03/13/2024) for annual physical w/pap-next avail.  Jenkins CHRISTELLA Carrel, MD

## 2023-12-13 ENCOUNTER — Encounter: Payer: Self-pay | Admitting: Gastroenterology

## 2023-12-18 ENCOUNTER — Other Ambulatory Visit: Payer: Self-pay | Admitting: Family Medicine

## 2023-12-18 DIAGNOSIS — Z1231 Encounter for screening mammogram for malignant neoplasm of breast: Secondary | ICD-10-CM

## 2024-01-17 ENCOUNTER — Ambulatory Visit
Admission: RE | Admit: 2024-01-17 | Discharge: 2024-01-17 | Disposition: A | Discharge status: Home / Self Care | Source: Ambulatory Visit | Attending: Family Medicine | Admitting: Family Medicine

## 2024-01-17 DIAGNOSIS — Z1231 Encounter for screening mammogram for malignant neoplasm of breast: Secondary | ICD-10-CM | POA: Diagnosis not present

## 2024-01-23 ENCOUNTER — Other Ambulatory Visit: Payer: Self-pay | Admitting: Family Medicine

## 2024-01-23 DIAGNOSIS — R928 Other abnormal and inconclusive findings on diagnostic imaging of breast: Secondary | ICD-10-CM

## 2024-01-25 ENCOUNTER — Ambulatory Visit: Payer: Self-pay | Admitting: Family Medicine

## 2024-01-30 ENCOUNTER — Ambulatory Visit: Admitting: Gastroenterology

## 2024-01-30 ENCOUNTER — Encounter: Payer: Self-pay | Admitting: Gastroenterology

## 2024-01-30 VITALS — BP 118/66 | HR 73 | Ht 60.5 in | Wt 147.2 lb

## 2024-01-30 DIAGNOSIS — K5730 Diverticulosis of large intestine without perforation or abscess without bleeding: Secondary | ICD-10-CM | POA: Diagnosis not present

## 2024-01-30 DIAGNOSIS — Z800 Family history of malignant neoplasm of digestive organs: Secondary | ICD-10-CM

## 2024-01-30 DIAGNOSIS — K5909 Other constipation: Secondary | ICD-10-CM | POA: Diagnosis not present

## 2024-01-30 DIAGNOSIS — R140 Abdominal distension (gaseous): Secondary | ICD-10-CM

## 2024-01-30 DIAGNOSIS — Z1211 Encounter for screening for malignant neoplasm of colon: Secondary | ICD-10-CM

## 2024-01-30 MED ORDER — NA SULFATE-K SULFATE-MG SULF 17.5-3.13-1.6 GM/177ML PO SOLN: 1.0000 | Freq: Once | ORAL | 0 refills | Status: AC

## 2024-01-30 NOTE — Progress Notes (Signed)
 "  Chief Complaint:constipation, bloating, colonoscopy  Primary GI Doctor:Dr. Suzann  HPI:  Patient is a  43  year old female patient with past medical history of hypertension, thyroid  disease, who was referred to me by Wendolyn Jenkins Jansky, MD on 12/12/23 for a evaluation of constipation, bloating, colonoscopy .    Interval History Patient originally form [place], presents for evaluation of constipation and bloating.  Chronic Constipation Failed high fiber diet Failed OTC laxatives Miralax or MOM She drinks plenty of fluids She is currently taking OTC Bisacodyl 5mg  every 3-4 days. She has BM every 3-4 days.  Pt on Zepbound  1 year  BRBPR, intermittent  She feels balls outside rectum thinks hemorrhoids She strains a lot.  Bloating Worse with constipation  No particular food triggers.  No alcohol use. Nonsmoker.   Never had colonoscopy/EGD  Surgical history:  Patient's family history includes father with colon CA- 60s   Wt Readings from Last 3 Encounters:  01/30/24 147 lb 4 oz (66.8 kg)  12/12/23 149 lb 6 oz (67.8 kg)  10/16/23 148 lb 2 oz (67.2 kg)    Past Medical History:  Diagnosis Date   Constipation    Hypertension    Thyroid  disease     Past Surgical History:  Procedure Laterality Date   COSMETIC SURGERY  2022   tummy tuck    Current Outpatient Medications  Medication Sig Dispense Refill   buPROPion  (WELLBUTRIN  XL) 150 MG 24 hr tablet Take 1 tablet (150 mg total) by mouth daily. 90 tablet 1   escitalopram  (LEXAPRO ) 10 MG tablet Take 1 tablet (10 mg total) by mouth daily. 90 tablet 1   hydrochlorothiazide  (MICROZIDE ) 12.5 MG capsule 1 CAPSULE IN THE MORNING ORALLY ONCE A DAY FOR 30 DAY(S) 90 capsule 1   levothyroxine  (SYNTHROID ) 100 MCG tablet Take 1 tablet (100 mcg total) by mouth daily before breakfast. 90 tablet 1   tirzepatide  (ZEPBOUND ) 12.5 MG/0.5ML Pen Inject 12.5 mg into the skin once a week. 6 mL 1   UBRELVY  50 MG TABS Take 1 tablet (50 mg total)  by mouth daily as needed. 8 tablet 3   No current facility-administered medications for this visit.    Allergies as of 01/30/2024 - Review Complete 01/30/2024  Allergen Reaction Noted   Lorcaserin Other (See Comments) 04/12/2022    Family History  Problem Relation Age of Onset   Diabetes Mother    Alcohol abuse Mother    Migraines Mother    Diabetes Father    Alcohol abuse Father    Breast cancer Neg Hx     Review of Systems:    Constitutional: No weight loss, fever, chills, weakness or fatigue HEENT: Eyes: No change in vision               Ears, Nose, Throat:  No change in hearing or congestion Skin: No rash or itching Cardiovascular: No chest pain, chest pressure or palpitations   Respiratory: No SOB or cough Gastrointestinal: See HPI and otherwise negative Genitourinary: No dysuria or change in urinary frequency Neurological: No headache, dizziness or syncope Musculoskeletal: No new muscle or joint pain Hematologic: No bleeding or bruising Psychiatric: No history of depression or anxiety    Physical Exam:  Vital signs: BP 118/66   Pulse 73   Ht 5' 0.5 (1.537 m) Comment: height measured without shoes  Wt 147 lb 4 oz (66.8 kg)   BMI 28.28 kg/m   Constitutional:   Pleasant  female appears to be in NAD,  Well developed, Well nourished, alert and cooperative Eyes:   PEERL, EOMI. No icterus. Conjunctiva pink. Neck:  Supple Throat: Oral cavity and pharynx without inflammation, swelling or lesion.  Respiratory: Respirations even and unlabored. Lungs clear to auscultation bilaterally.   No wheezes, crackles, or rhonchi.  Cardiovascular: Normal S1, S2. Regular rate and rhythm. No peripheral edema, cyanosis or pallor.  Gastrointestinal:  Soft, nondistended, nontender. No rebound or guarding. Normal bowel sounds. No appreciable masses or hepatomegaly. Rectal:  Not performed. Declined Msk:  Symmetrical without gross deformities. Without edema, no deformity or joint  abnormality.  Neurologic:  Alert and  oriented x4;  grossly normal neurologically.  Skin:   Dry and intact without significant lesions or rashes.  RELEVANT LABS AND IMAGING: CBC    Latest Ref Rng & Units 08/13/2023   12:23 PM 02/15/2023    6:11 PM 12/14/2022    9:25 AM  CBC  WBC 4.0 - 10.5 K/uL 6.1  6.4  7.1   Hemoglobin 12.0 - 15.0 g/dL 86.6  85.9  86.4   Hematocrit 36.0 - 46.0 % 38.5  40.3  39.9   Platelets 150.0 - 400.0 K/uL 381.0  406  402.0      CMP     Latest Ref Rng & Units 08/13/2023   12:23 PM 02/15/2023    6:11 PM 12/14/2022    9:25 AM  CMP  Glucose 70 - 99 mg/dL 95  877  83   BUN 6 - 23 mg/dL 22  24  22    Creatinine 0.40 - 1.20 mg/dL 9.27  9.23  9.31   Sodium 135 - 145 mEq/L 139  140  141   Potassium 3.5 - 5.1 mEq/L 3.7  3.1  3.7   Chloride 96 - 112 mEq/L 101  104  105   CO2 19 - 32 mEq/L 31  27  28    Calcium 8.4 - 10.5 mg/dL 9.8  9.7  9.6   Total Protein 6.0 - 8.3 g/dL 7.8  8.0  7.0   Total Bilirubin 0.2 - 1.2 mg/dL 0.6  0.7  0.5   Alkaline Phos 39 - 117 U/L 53  62  55   AST 0 - 37 U/L 21  18  21    ALT 0 - 35 U/L 30  30  37      Lab Results  Component Value Date   TSH 0.88 08/13/2023   Imaging:  02/2023 CTAP  IMPRESSION: 1. No acute findings. 2. Sigmoid diverticulosis. 3. Dilated refluxing left ovarian vein with engorged left parametrial venous plexus. Correlate for pelvic congestion syndrome.  Assessment: Encounter Diagnoses  Name Primary?   Family history of colon cancer in father Yes   Diverticulosis of colon without hemorrhage    Special screening for malignant neoplasms, colon    Chronic constipation    Bloating   43 year old female patient who presents with constipation and bloating. Reports it has become worse over the last year or so. CTAP 02/2023 with diverticular disease otherwise no acute findings. Labs normal July 2025. She is on Zepbound  and educated her this could be potential side effect. Also hx of thyroid  disease. Not relieved with high  fiber diet or OTC laxatives. Will provide samples of pro secretory agent Linzess 72 mcg po daily.  Also has family hx of colon CA in father and due for surveillance screening will go ahead and schedule colonoscopy with 2 day prep with Dr. Suzann.   Plan: - Recommend  High fiber diet  -  Drink plenty of fluids -samples of Linzess 72 mcg po daily provided Schedule for a colonoscopy with 2 day prep in [facility] with Dr. Suzann. The risks and benefits of colonoscopy with possible polypectomy / biopsies were discussed and the patient agrees to proceed.  -Hold Zepbound  1 week before colonoscopy    Thank you for the courtesy of this consult. Please call me with any questions or concerns.   Lyvia Mondesir, FNP-C  Gastroenterology 01/30/2024, 10:41 AM  Cc: Wendolyn Jenkins Jansky, MD  I have reviewed the clinic note as outlined by Cathryne Beal, NP and agree with the assessment, plan and medical decision making.  Ms. Kwasnik is seen for evaluation of constipation and bloating.  CTAP reassuring.  Dorlis Judice be related to GLP-1 medication use.  There is a family history of colon cancer in her father and she is due for screening.  Agree with colonoscopy with 2-day bowel prep and trial of Linzess.  Inocente Suzann, MD  "

## 2024-01-30 NOTE — Patient Instructions (Addendum)
 Bloating  OTC IBGARD can helps with bloating Improving constipation may also help with the bloating  Avoid carbonated drinks, drinking with straws, and take your time eating  Constipation Recommend high fiber diet Drink plenty of fluids  Activity as tolerated Samples of Linzess 72 mcg po daily, take 1 tablet 30-45 mins before first meal of day with full glass of water If works well let us  know and we will send prescription  Hemorrhoids No pushing or straining Purchase squatty potty    Diverticulosis  Recommend high fiber diet Handout   We have sent the following medications to your pharmacy for you to pick up at your convenience: SUPREP  You have been scheduled for a colonoscopy. Please follow written instructions given to you at your visit today.   If you use inhalers (even only as needed), please bring them with you on the day of your procedure.  DO NOT TAKE 7 DAYS PRIOR TO TEST- Trulicity (dulaglutide) Ozempic, Wegovy (semaglutide) Mounjaro , Zepbound  (tirzepatide ) Bydureon Bcise (exanatide extended release)  DO NOT TAKE 1 DAY PRIOR TO YOUR TEST Rybelsus (semaglutide) Adlyxin (lixisenatide) Victoza (liraglutide) Byetta (exanatide) ___________________________________________________________________________  Due to recent changes in healthcare laws, you may see the results of your imaging and laboratory studies on MyChart before your provider has had a chance to review them.  We understand that in some cases there may be results that are confusing or concerning to you. Not all laboratory results come back in the same time frame and the provider may be waiting for multiple results in order to interpret others.  Please give us  48 hours in order for your provider to thoroughly review all the results before contacting the office for clarification of your results.   _______________________________________________________  If your blood pressure at your visit was 140/90 or  greater, please contact your primary care physician to follow up on this.  _______________________________________________________  If you are age 45 or older, your body mass index should be between 23-30. Your Body mass index is 28.28 kg/m. If this is out of the aforementioned range listed, please consider follow up with your Primary Care Provider.  If you are age 45 or younger, your body mass index should be between 19-25. Your Body mass index is 28.28 kg/m. If this is out of the aformentioned range listed, please consider follow up with your Primary Care Provider.   ________________________________________________________  The [facility] providers would like to encourage you to use MYCHART to communicate with providers for non-urgent requests or questions.  Due to long hold times on the telephone, sending your provider a message by [facility] may be a faster and more efficient way to get a response.  Please allow 48 business hours for a response.  Please remember that this is for non-urgent requests.  _______________________________________________________  [facility] is using a team-based approach to care.  Your team is made up of your doctor and two to three APPS. Our APPS (Nurse Practitioners and Physician Assistants) work with your physician to ensure care continuity for you. They are fully qualified to address your health concerns and develop a treatment plan. They communicate directly with your gastroenterologist to care for you. Seeing the Advanced Practice Practitioners on your physician's team can help you by facilitating care more promptly, often allowing for earlier appointments, access to diagnostic testing, procedures, and other specialty referrals.   Thank you for trusting me with your gastrointestinal care. Deanna May, FNP-C

## 2024-02-11 ENCOUNTER — Ambulatory Visit: Payer: Self-pay | Admitting: Family Medicine

## 2024-02-11 ENCOUNTER — Ambulatory Visit
Admission: RE | Admit: 2024-02-11 | Discharge: 2024-02-11 | Disposition: A | Discharge status: Home / Self Care | Source: Ambulatory Visit | Attending: Family Medicine | Admitting: Family Medicine

## 2024-02-11 ENCOUNTER — Other Ambulatory Visit: Payer: Self-pay | Admitting: Family Medicine

## 2024-02-11 DIAGNOSIS — R928 Other abnormal and inconclusive findings on diagnostic imaging of breast: Secondary | ICD-10-CM

## 2024-02-11 DIAGNOSIS — N6323 Unspecified lump in the left breast, lower outer quadrant: Secondary | ICD-10-CM

## 2024-02-12 NOTE — Progress Notes (Unsigned)
 [facility] History and Physical   Primary Care Physician:  Wendolyn Jenkins Jansky, MD   Reason for Procedure:  Constipation, bloating, bright red blood per rectum, family history of colorectal cancer  Plan:    Colonoscopy   The patient was provided an opportunity to ask questions and all were answered. The patient agreed with the plan.   HPI: Maria Vasquez is a 44 y.o. female undergoing colonoscopy for investigation of constipation, bloating, bright red blood per rectum and family history of colorectal cancer.  Patient reports worsening constipation and bloating over the last year.  Laboratory studies in July 2025 were normal.  CTAP 02/2023 demonstrated diverticular disease but no acute findings.  Currently on Zepbound .  Also documented history of patient's father having colorectal cancer in his 73s.   Past Medical History:  Diagnosis Date   Constipation    Hypertension    Thyroid  disease     Past Surgical History:  Procedure Laterality Date   COSMETIC SURGERY  2022   tummy tuck    Prior to Admission medications  Medication Sig Start Date End Date Taking? Authorizing Provider  buPROPion  (WELLBUTRIN  XL) 150 MG 24 hr tablet Take 1 tablet (150 mg total) by mouth daily. 09/11/23   Wendolyn Jenkins Jansky, MD  escitalopram  (LEXAPRO ) 10 MG tablet Take 1 tablet (10 mg total) by mouth daily. 09/11/23   Wendolyn Jenkins Jansky, MD  hydrochlorothiazide  (MICROZIDE ) 12.5 MG capsule 1 CAPSULE IN THE MORNING ORALLY ONCE A DAY FOR 30 DAY(S) 08/23/23   Wendolyn Jenkins Jansky, MD  levothyroxine  (SYNTHROID ) 100 MCG tablet Take 1 tablet (100 mcg total) by mouth daily before breakfast. 09/11/23   Wendolyn Jenkins Jansky, MD  tirzepatide  (ZEPBOUND ) 12.5 MG/0.5ML Pen Inject 12.5 mg into the skin once a week. 12/12/23   Wendolyn Jenkins Jansky, MD  UBRELVY  50 MG TABS Take 1 tablet (50 mg total) by mouth daily as needed. 05/22/23   Wendolyn Jenkins Jansky, MD    Current Outpatient Medications  Medication Sig Dispense Refill   buPROPion   (WELLBUTRIN  XL) 150 MG 24 hr tablet Take 1 tablet (150 mg total) by mouth daily. 90 tablet 1   escitalopram  (LEXAPRO ) 10 MG tablet Take 1 tablet (10 mg total) by mouth daily. 90 tablet 1   levothyroxine  (SYNTHROID ) 100 MCG tablet Take 1 tablet (100 mcg total) by mouth daily before breakfast. 90 tablet 1   Na Sulfate-K Sulfate-Mg Sulfate concentrate (SUPREP) 17.5-3.13-1.6 GM/177ML SOLN SMARTSIG:1 Kit(s) By Mouth Once     UBRELVY  50 MG TABS Take 1 tablet (50 mg total) by mouth daily as needed. 8 tablet 3   hydrochlorothiazide  (MICROZIDE ) 12.5 MG capsule 1 CAPSULE IN THE MORNING ORALLY ONCE A DAY FOR 30 DAY(S) (Patient not taking: Reported on 02/13/2024) 90 capsule 1   tirzepatide  (ZEPBOUND ) 12.5 MG/0.5ML Pen Inject 12.5 mg into the skin once a week. 6 mL 1   Current Facility-Administered Medications  Medication Dose Route Frequency Provider Last Rate Last Admin   0.9 %  sodium chloride  infusion  500 mL Intravenous Once Tonnya Garbett, Inocente HERO, MD        Allergies as of 02/13/2024   (No Active Allergies)    Family History  Problem Relation Age of Onset   Diabetes Mother    Alcohol abuse Mother    Migraines Mother    Colon cancer Father    Diabetes Father    Alcohol abuse Father    Breast cancer Neg Hx    Rectal cancer Neg Hx  Stomach cancer Neg Hx     Social History   Socioeconomic History   Marital status: Married    Spouse name: Not on file   Number of children: 2   Years of education: Not on file   Highest education level: Some college, no degree  Occupational History   Occupation: CNA    Comment: [employer]  Tobacco Use   Smoking status: Never   Smokeless tobacco: Never  Vaping Use   Vaping status: Never Used  Substance and Sexual Activity   Alcohol use: Yes    Comment: occ   Drug use: Never   Sexual activity: Yes    Birth control/protection: Surgical    Comment: vasectomy husb  Other Topics Concern   Not on file  Social History Narrative   Not on file   Social Drivers  of Health   Tobacco Use: Low Risk (02/13/2024)   Patient History    Smoking Tobacco Use: Never    Smokeless Tobacco Use: Never    Passive Exposure: Not on file  Financial Resource Strain: Low Risk (08/13/2023)   Overall Financial Resource Strain (CARDIA)    Difficulty of Paying Living Expenses: Not hard at all  Food Insecurity: No Food Insecurity (08/13/2023)   Epic    Worried About Radiation Protection Practitioner of Food in the Last Year: Never true    Ran Out of Food in the Last Year: Never true  Transportation Needs: No Transportation Needs (08/13/2023)   Epic    Lack of Transportation (Medical): No    Lack of Transportation (Non-Medical): No  Physical Activity: Sufficiently Active (08/13/2023)   Exercise Vital Sign    Days of Exercise per Week: 3 days    Minutes of Exercise per Session: 50 min  Stress: Stress Concern Present (08/13/2023)   [employer] of Occupational Health - Occupational Stress Questionnaire    Feeling of Stress: Very much  Social Connections: Moderately Isolated (08/13/2023)   Social Connection and Isolation Panel    Frequency of Communication with Friends and Family: More than three times a week    Frequency of Social Gatherings with Friends and Family: Once a week    Attends Religious Services: Patient declined    Database Administrator or Organizations: No    Attends Engineer, Structural: Not on file    Marital Status: Married  Intimate Partner Violence: Unknown (06/13/2022)   Received from [facility]   HITS    Physically Hurt: Not on file    Insult or Talk Down To: Not on file    Threaten Physical Harm: Not on file    Scream or Curse: Not on file  Depression (PHQ2-9): High Risk (08/13/2023)   Depression (PHQ2-9)    PHQ-2 Score: 21  Alcohol Screen: Low Risk (08/13/2023)   Alcohol Screen    Last Alcohol Screening Score (AUDIT): 1  Housing: Unknown (08/13/2023)   Epic    Unable to Pay for Housing in the Last Year: No    Number of Times Moved in the Last Year: Not on  file    Homeless in the Last Year: No  Utilities: Not on file  Health Literacy: Not on file    Review of Systems:  All other review of systems negative except as mentioned in the HPI.  Physical Exam: Vital signs BP 117/75   Pulse 74   Temp 98.1 F (36.7 C)   Resp 12   Ht 5' (1.524 m)   Wt 147 lb (66.7 kg)  SpO2 100%   BMI 28.71 kg/m   General:   Alert,  Well-developed, well-nourished, pleasant and cooperative in NAD Airway:  Mallampati 2 Lungs:  Clear throughout to auscultation.   Heart:  Regular rate and rhythm; no murmurs, clicks, rubs,  or gallops. Abdomen:  Soft, nontender and nondistended. Normal bowel sounds.   Neuro/Psych:  Normal mood and affect. A and O x 3  Inocente Hausen, MD [facility]

## 2024-02-12 NOTE — Progress Notes (Signed)
 Pt has read results and no questions

## 2024-02-13 ENCOUNTER — Encounter: Payer: Self-pay | Admitting: Pediatrics

## 2024-02-13 ENCOUNTER — Ambulatory Visit: Admitting: Pediatrics

## 2024-02-13 VITALS — BP 98/66 | HR 72 | Temp 98.1°F | Resp 13 | Ht 60.0 in | Wt 147.0 lb

## 2024-02-13 DIAGNOSIS — K621 Rectal polyp: Secondary | ICD-10-CM

## 2024-02-13 DIAGNOSIS — K648 Other hemorrhoids: Secondary | ICD-10-CM

## 2024-02-13 DIAGNOSIS — Z1211 Encounter for screening for malignant neoplasm of colon: Secondary | ICD-10-CM | POA: Diagnosis present

## 2024-02-13 DIAGNOSIS — Z800 Family history of malignant neoplasm of digestive organs: Secondary | ICD-10-CM | POA: Diagnosis not present

## 2024-02-13 DIAGNOSIS — K5730 Diverticulosis of large intestine without perforation or abscess without bleeding: Secondary | ICD-10-CM | POA: Diagnosis not present

## 2024-02-13 DIAGNOSIS — D128 Benign neoplasm of rectum: Secondary | ICD-10-CM

## 2024-02-13 MED ORDER — SODIUM CHLORIDE 0.9 % IV SOLN: 500.0000 mL | Freq: Once | INTRAVENOUS | Status: DC

## 2024-02-13 NOTE — Progress Notes (Signed)
 Pt's states no medical or surgical changes since previsit or office visit.

## 2024-02-13 NOTE — Patient Instructions (Signed)
 Resume previous diet and medications. Awaiting pathology results. Repeat Colonoscopy in 5 years for screening purposes. Handouts provided on Colon polyps, Diverticulosis and Hemorrhoids  YOU HAD AN ENDOSCOPIC PROCEDURE TODAY AT THE [facility]:   Refer to the procedure report that was given to you for any specific questions about what was found during the examination.  If the procedure report does not answer your questions, please call your gastroenterologist to clarify.  If you requested that your care partner not be given the details of your procedure findings, then the procedure report has been included in a sealed envelope for you to review at your convenience later.  YOU SHOULD EXPECT: Some feelings of bloating in the abdomen. Passage of more gas than usual.  Walking can help get rid of the air that was put into your GI tract during the procedure and reduce the bloating. If you had a lower endoscopy (such as a colonoscopy or flexible sigmoidoscopy) you may notice spotting of blood in your stool or on the toilet paper. If you underwent a bowel prep for your procedure, you may not have a normal bowel movement for a few days.  Please Note:  You might notice some irritation and congestion in your nose or some drainage.  This is from the oxygen used during your procedure.  There is no need for concern and it should clear up in a day or so.  SYMPTOMS TO REPORT IMMEDIATELY:  Following lower endoscopy (colonoscopy or flexible sigmoidoscopy):  Excessive amounts of blood in the stool  Significant tenderness or worsening of abdominal pains  Swelling of the abdomen that is new, acute  Fever of 100F or higher  For urgent or emergent issues, a gastroenterologist can be reached at any hour by calling [phone]. Do not use MyChart messaging for urgent concerns.    DIET:  We do recommend a small meal at first, but then you may proceed to your regular diet.  Drink plenty of fluids but you  should avoid alcoholic beverages for 24 hours.  ACTIVITY:  You should plan to take it easy for the rest of today and you should NOT DRIVE or use heavy machinery until tomorrow (because of the sedation medicines used during the test).    FOLLOW UP: Our staff will call the number listed on your records the next business day following your procedure.  We will call around 7:15- 8:00 am to check on you and address any questions or concerns that you may have regarding the information given to you following your procedure. If we do not reach you, we will leave a message.     If any biopsies were taken you will be contacted by phone or by letter within the next 1-3 weeks.  Please call us  at [phone] if you have not heard about the biopsies in 3 weeks.    SIGNATURES/CONFIDENTIALITY: You and/or your care partner have signed paperwork which will be entered into your electronic medical record.  These signatures attest to the fact that that the information above on your After Visit Summary has been reviewed and is understood.  Full responsibility of the confidentiality of this discharge information lies with you and/or your care-partner.

## 2024-02-13 NOTE — Progress Notes (Signed)
 Called to room to assist during endoscopic procedure.  Patient ID and intended procedure confirmed with present staff. Received instructions for my participation in the procedure from the performing physician.

## 2024-02-13 NOTE — Op Note (Signed)
 [facility] Patient Name: Maria Vasquez Procedure Date: 02/13/2024 9:26 AM MRN: [id number] Endoscopist: Inocente Hausen , MD, [id number] Age: 44 Referring MD:  Date of Birth: [birth year] Gender: Female Account #: [id number] Procedure:                Colonoscopy Indications:              Screening patient at increased risk: Family history                            of 1st-degree relative with colorectal cancer at                            age 18 years (or older), This is the patient's                            first colonoscopy, Incidental - Rectal bleeding,                            Incidental constipation and bloating noted Medicines:                Monitored Anesthesia Care Procedure:                Pre-Anesthesia Assessment:                           - Prior to the procedure, a History and Physical                            was performed, and patient medications and                            allergies were reviewed. The patient's tolerance of                            previous anesthesia was also reviewed. The risks                            and benefits of the procedure and the sedation                            options and risks were discussed with the patient.                            All questions were answered, and informed consent                            was obtained. Prior Anticoagulants: The patient has                            taken no anticoagulant or antiplatelet agents. ASA                            Grade Assessment: II - A patient with mild systemic  disease. After reviewing the risks and benefits,                            the patient was deemed in satisfactory condition to                            undergo the procedure.                           After obtaining informed consent, the colonoscope                            was passed under direct vision. Throughout the                            procedure, the  patient's blood pressure, pulse, and                            oxygen saturations were monitored continuously. The                            [id number]-HQ190L Colonoscope [id number] was introduced                            through the anus and advanced to the terminal                            ileum. The colonoscopy was performed without                            difficulty. The patient tolerated the procedure                            well. The quality of the bowel preparation was                            good. The terminal ileum, ileocecal valve,                            appendiceal orifice, and rectum were photographed. Scope In: 9:59:13 AM Scope Out: 10:13:01 AM Scope Withdrawal Time: 0 hours 10 minutes 28 seconds  Total Procedure Duration: 0 hours 13 minutes 48 seconds  Findings:                 The perianal and digital rectal examinations were                            normal. Pertinent negatives include normal                            sphincter tone and no palpable rectal lesions.                           Multiple small-mouthed diverticula were found in  the sigmoid colon and descending colon.                           A 4 mm polyp was found in the rectum. The polyp was                            sessile. The polyp was removed with a cold biopsy                            forceps. Resection and retrieval were complete.                           The terminal ileum appeared normal.                           Internal hemorrhoids were found during retroflexion. Complications:            No immediate complications. Estimated blood loss:                            Minimal. Estimated Blood Loss:     Estimated blood loss was minimal. Impression:               - Diverticulosis in the sigmoid colon and in the                            descending colon.                           - One 4 mm polyp in the rectum, removed with a cold                             biopsy forceps. Resected and retrieved.                           - The examined portion of the ileum was normal.                           - Internal hemorrhoids. This is the likely source                            fo rectal bleeding. Recommendation:           - Discharge patient to home (ambulatory).                           - Await pathology results.                           - Repeat colonoscopy in 5 years for screening                            purposes.                           - The findings and recommendations were discussed  with the patient's family.                           - Patient has a contact number available for                            emergencies. The signs and symptoms of potential                            delayed complications were discussed with the                            patient. Return to normal activities tomorrow.                            Written discharge instructions were provided to the                            patient. Inocente Hausen, MD 02/13/2024 10:18:54 AM This report has been signed electronically.

## 2024-02-13 NOTE — Progress Notes (Signed)
 Report to PACU, RN, vss, BBS= Clear.

## 2024-02-14 ENCOUNTER — Telehealth: Payer: Self-pay

## 2024-02-14 NOTE — Telephone Encounter (Signed)
 No answer on follow up call - voice mail message left

## 2024-02-15 LAB — SURGICAL PATHOLOGY

## 2024-02-17 ENCOUNTER — Ambulatory Visit: Payer: Self-pay | Admitting: Pediatrics

## 2024-02-29 ENCOUNTER — Other Ambulatory Visit: Payer: Self-pay | Admitting: Family Medicine

## 2024-02-29 DIAGNOSIS — E038 Other specified hypothyroidism: Secondary | ICD-10-CM

## 2024-02-29 DIAGNOSIS — G43009 Migraine without aura, not intractable, without status migrainosus: Secondary | ICD-10-CM

## 2024-03-18 ENCOUNTER — Encounter: Admitting: Family Medicine

## 2024-08-12 ENCOUNTER — Encounter

## 2024-08-12 ENCOUNTER — Other Ambulatory Visit
